# Patient Record
Sex: Female | Born: 1940 | Race: White | Hispanic: No | Marital: Married | Smoking: Never smoker
Health system: Southern US, Community
[De-identification: ages and names within clinical notes are randomized; demographics above are authoritative.]

## PROBLEM LIST (undated history)

## (undated) DIAGNOSIS — F419 Anxiety disorder, unspecified: Secondary | ICD-10-CM

## (undated) DIAGNOSIS — C4490 Unspecified malignant neoplasm of skin, unspecified: Secondary | ICD-10-CM

## (undated) DIAGNOSIS — N390 Urinary tract infection, site not specified: Secondary | ICD-10-CM

## (undated) DIAGNOSIS — R634 Abnormal weight loss: Secondary | ICD-10-CM

## (undated) DIAGNOSIS — K5900 Constipation, unspecified: Secondary | ICD-10-CM

## (undated) DIAGNOSIS — F0390 Unspecified dementia without behavioral disturbance: Secondary | ICD-10-CM

## (undated) HISTORY — PX: CATARACT EXTRACTION: SUR2

## (undated) HISTORY — PX: OTHER SURGICAL HISTORY: SHX169

## (undated) HISTORY — PX: APPENDECTOMY: SHX54

---

## 1998-12-18 ENCOUNTER — Encounter: Payer: Self-pay | Admitting: Emergency Medicine

## 1998-12-18 ENCOUNTER — Emergency Department (HOSPITAL_COMMUNITY): Admission: EM | Admit: 1998-12-18 | Discharge: 1998-12-19 | Payer: Self-pay | Admitting: Emergency Medicine

## 1999-02-07 ENCOUNTER — Other Ambulatory Visit: Admission: RE | Admit: 1999-02-07 | Discharge: 1999-02-07 | Payer: Self-pay | Admitting: Obstetrics & Gynecology

## 1999-08-17 ENCOUNTER — Other Ambulatory Visit: Admission: RE | Admit: 1999-08-17 | Discharge: 1999-08-17 | Payer: Self-pay | Admitting: Obstetrics & Gynecology

## 2000-07-06 ENCOUNTER — Emergency Department (HOSPITAL_COMMUNITY): Admission: EM | Admit: 2000-07-06 | Discharge: 2000-07-06 | Payer: Self-pay | Admitting: Emergency Medicine

## 2000-09-09 ENCOUNTER — Other Ambulatory Visit: Admission: RE | Admit: 2000-09-09 | Discharge: 2000-09-09 | Payer: Self-pay | Admitting: Obstetrics and Gynecology

## 2001-10-21 ENCOUNTER — Other Ambulatory Visit: Admission: RE | Admit: 2001-10-21 | Discharge: 2001-10-21 | Payer: Self-pay | Admitting: Obstetrics & Gynecology

## 2002-03-06 ENCOUNTER — Emergency Department (HOSPITAL_COMMUNITY): Admission: EM | Admit: 2002-03-06 | Discharge: 2002-03-07 | Payer: Self-pay | Admitting: Emergency Medicine

## 2002-03-08 ENCOUNTER — Emergency Department (HOSPITAL_COMMUNITY): Admission: EM | Admit: 2002-03-08 | Discharge: 2002-03-08 | Payer: Self-pay | Admitting: Emergency Medicine

## 2002-10-20 ENCOUNTER — Other Ambulatory Visit: Admission: RE | Admit: 2002-10-20 | Discharge: 2002-10-20 | Payer: Self-pay | Admitting: Obstetrics & Gynecology

## 2003-10-18 ENCOUNTER — Other Ambulatory Visit: Admission: RE | Admit: 2003-10-18 | Discharge: 2003-10-18 | Payer: Self-pay | Admitting: Obstetrics & Gynecology

## 2008-05-30 ENCOUNTER — Emergency Department (HOSPITAL_COMMUNITY): Admission: EM | Admit: 2008-05-30 | Discharge: 2008-05-30 | Payer: Self-pay | Admitting: Emergency Medicine

## 2008-07-20 ENCOUNTER — Inpatient Hospital Stay (HOSPITAL_COMMUNITY): Admission: RE | Admit: 2008-07-20 | Discharge: 2008-07-22 | Payer: Self-pay | Admitting: Obstetrics and Gynecology

## 2008-07-20 ENCOUNTER — Encounter (INDEPENDENT_AMBULATORY_CARE_PROVIDER_SITE_OTHER): Payer: Self-pay | Admitting: Obstetrics and Gynecology

## 2011-02-13 NOTE — H&P (Signed)
NAMEDESERAY, DAPONTE                 ACCOUNT NO.:  [id number]   MEDICAL RECORD NO.:  [id number]          PATIENT TYPE:  AMB   LOCATION:  SDC                           FACILITY:  [facility]   PHYSICIAN:  Randye Lobo, M.D.   DATE OF BIRTH:  05/17/1941   DATE OF ADMISSION:  DATE OF DISCHARGE:                              HISTORY & PHYSICAL   CHIEF COMPLAINT:  Uterine prolapse and urinary incontinence.   HISTORY OF PRESENT ILLNESS:  The patient is a 70 year old gravida 4,  para 3-0-1-3 Caucasian female, status post bilateral tubal ligation, who  presents with worsening uterine prolapse and the presence of urinary  incontinence.  The patient has been followed for the above symptoms and  has chosen to avoid surgery until this time due to the progressive  nature of the problem.  The patient has failed the use of a pessary.  She is experiencing urinary incontinence with sneezing.  She has  developed recurrent urinary tract infections this summer, and she was  seen at [facility] on May 30, 2008, due to severe  constipation.   In preparation for the patient's upcoming surgery, she did undergo  multichannel urodynamic studies.  She had her pessary in place during  this procedure.  The uroflowmetry study documented a void of 168 mL with  a postvoid residual of 150 mL.  The patient had the multichannel  cystometrics performed and she developed a detrusor contraction at 236  mL at which time she had uncontrolled urinary incontinence and was not  able to fill further.   The patient now wishes for surgical treatment of the prolapse and the  incontinence.   The patient's past obstetric and gynecologic histories are remarkable  for:  1. Status post vaginal delivery x3.  2. Status post bilateral tubal ligation.  3. The patient's last Pap smear was performed on November 26, 2007,      and was within normal limits.  Her last mammogram was performed on      June 03, 2007, and was within  normal limits.  4. The patient has been on Prempro hormone therapy, and she has      stopped this in preparation for her procedure.   PAST MEDICAL HISTORY:  1. Hyperlipidemia.  2. Recurrent urinary tract infections.   SURGICAL HISTORY:  Status post bilateral tubal ligation.   MEDICATIONS:  Zyrtec, Prempro, a hyperlipidemic of unknown name.   ALLERGIES:  CODEINE.   SOCIAL HISTORY:  The patient is married.  She works for [employer]  in [place].  She denies the use of tobacco.   PHYSICAL EXAMINATION:  VITAL SIGNS:  The patient's height is 5 feet 2-  1/2 inches, weight 112 pounds, blood pressure 100/66.  GENERAL:  The  patient is an elderly Caucasian female in no acute distress.  HEENT:  Normocephalic, atraumatic.  LUNGS:  Clear to auscultation bilaterally.  HEART:  S1-S2 with a regular rate and rhythm.  ABDOMEN:  Soft and nontender with no evidence of hepatosplenomegaly or  organomegaly.  PELVIC:  There is evidence of complete uterine procidentia.  The vaginal  mucosa demonstrates no ulcerations.  The cervix appears to be  unremarkable.  With reduction of the prolapse and bimanual exam, the  uterus is small and nontender and there is no evidence adnexal masses or  tenderness.   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   PLAN:  The patient will undergo a total vaginal hysterectomy with  possible bilateral salpingo-oophorectomy, an anterior and posterior  colporrhaphy, a sacrospinous vaginal vault fixation, tension-free  vaginal tape suburethral sling and cystoscopy at the [facility]  [place] on July 20, 2008.  The risks, benefits, and alternatives  have been discussed with the patient, who wishes to proceed.      Randye Lobo, M.D.  Electronically Signed     BES/MEDQ  D:  07/19/2008  T:  07/19/2008  Job:   [id number]

## 2011-02-13 NOTE — Op Note (Signed)
Gwendolyn Bright, Gwendolyn Bright                 ACCOUNT NO.:  [id number]   MEDICAL RECORD NO.:  [id number]          PATIENT TYPE:  INP   LOCATION:  9316                          [facility]ACILITY:  [facility]   PHYSICIAN:  Randye Lobo, M.D.   DATE OF BIRTH:  16-Nov-1940   DATE OF PROCEDURE:  07/20/2008  DATE OF DISCHARGE:                               OPERATIVE REPORT   PREOPERATIVE DIAGNOSES:  1. Complete uterovaginal prolapse.  2. Genuine stress incontinence.   POSTOPERATIVE DIAGNOSES:  1. Complete uterovaginal prolapse.  2. Genuine stress incontinence.   PROCEDURE:  Total vaginal hysterectomy, anterior and posterior  colporrhaphy, sacrospinous vaginal vault suspension, tension-free  vaginal tape mid urethral sling, and cystoscopy.   SURGEON:  Randye Lobo, MD   ASSISTANT:  Gerrit Friends. Aldona Bar, MD   ANESTHESIA:  General endotracheal, local with 0.5% lidocaine with  epinephrine 1:200,000.   IV FLUIDS:  2000 mL Ringer's lactate.   ESTIMATED BLOOD LOSS:  200 mL   URINE OUTPUT:  700 mL   COMPLICATIONS:  None.   INDICATIONS FOR THE PROCEDURE:  The patient is a 70 year old gravida 4,  para 3-0-1-3 Caucasian female, status post bilateral tubal ligation who  presented with worsening uterine prolapse and urinary incontinence with  sneezing.  The patient has failed the use of a pessary and has developed  recurrent urinary tract infections and severe constipation, and she now  chooses to proceed with the surgical treatment of the prolapse and  incontinence.  The patient did have multichannel urodynamic testing in  the office and developed a detrusor contraction during her cystometric  portion of the study, at which time, she lost a complete control of her  bladder and was unable to fill further.  The patient had reduction of  her prolapse during the urodynamic procedure using her pessary.  The  patient was experiencing stress incontinence with a pessary in place.  She is noted to have complete procidentia  on exam.  A plan is now made  to proceed with a total vaginal hysterectomy with possible bilateral  salpingo-oophorectomy, an anterior and posterior colporrhaphy, a  sacrospinous vaginal vault suspension, a TVT mid urethral sling, and a  cystoscopy after risks, benefits, and alternatives were reviewed.   FINDINGS:  Exam under anesthesia revealed complete procidentia.  There  was a very significant cystocele component to the procidentia and there  was less rectocele appreciated.  The uterus, tubes, and ovaries were  unremarkable.  The bilateral ovaries appeared to be very atrophic and  streak-like along the pelvic sidewalls.   Cystoscopy during placement of the sling demonstrated a absence of a  foreign body in either the bladder or the urethra.  The bladder was  noted to be normal throughout 360 degrees including the bladder dome and  trigone.  The ureters were noted to be patent bilaterally after the  injection of indigo carmine dye IV.   SPECIMENS:  The uterus was sent to Pathology.   PROCEDURE:  The patient was reidentified in the preoperative hold area.  She received cefotetan 2 g IV for antibiotic prophylaxis.  She  received  both TED hose and PAS stockings for DVT prophylaxis.   In the operating room, general endotracheal anesthesia was induced and  the patient was then placed in the dorsal lithotomy position.  The lower  abdomen, vagina, and perineum were sterilely prepped and draped.  A  Foley catheter was placed inside the bladder.  A tenaculum was placed on  the anterior and posterior cervical lips, and the cervix was injected  locally with 0.5% lidocaine with 1:200,000 of epinephrine.  The cervix  was then circumscribed with a scalpel.  Entry into the posterior cul-de-  sac was performed sharply with a Mayo scissors.  Digital exam confirmed  proper entry into this location.  Each of the uterosacral ligaments were  then clamped, sharply divided, and suture ligated with  transfixing  sutures of 0 Vicryl.  The bladder was then dissected off of the lower  uterine segment.  Each of the bladder pillars were clamped, sharply  divided, and suture ligated with 0 Vicryl bilaterally.  Dissection  continued anteriorly to dissect the bladder off of the lower uterine  segment.  The upper portion of the uterosacral ligaments were then  clamped, sharply divided, and suture ligated with 0 Vicryl bilaterally.   With continued dissection in the uterovesical fold, it became clear that  the dissection was going into the lower uterine segment and the plane  was reidentified and then the anterior cul-de-sac was entered sharply.  Again, digital exam confirmed proper entry into this location.   The cardinal ligaments were then clamped, sharply divided, and suture  ligated with 0 Vicryl bilaterally.  The procedure continued through the  broad ligaments, which were similarly clamped with a Heaney clamp,  sharply divided, and suture ligated with 0 Vicryl on each side.  The  Heaney clamps were then used to clamp across the adnexal structures  including the remainder of the proximal fallopian tube, utero-ovarian  ligament, and the round ligament bilaterally.  These pedicles were then  sharply divided.  Each of the pedicles was free tied with a free tie of  0 Vicryl followed by a suture ligature of the same.  These pedicles were  held at this time.  All of the pedicles were examined and hemostasis was  noted to be good.   The anterior colporrhaphy was performed next.  Allis clamps were used to  mark the midline of the anterior vaginal mucosa.  The mucosa was  injected locally with 0.5% lidocaine with 1:200,000 of epinephrine.  The  vaginal mucosa was then incised vertically with the Metzenbaum scissors.  Sharp dissection was used to dissect the subvaginal tissue and bladder  off the overlying vaginal mucosa.  The dissection was carried anteriorly  to the level of the pubic rami  and posteriorly to the level of the  uterosacral ligament pedicles.  Hemostasis during the dissection was  created with monopolar cautery.   The mid urethral sling was performed next.  A 1-cm suprapubic incisions  were created and 2 cm to the right and left in the midline.  The sling  was performed in a top-down fashion.  The abdominal needle passer was  placed through the right suprapubic incision and out through the  endopelvic fascia along the ipsilateral side and lateral to the mid  urethra.  The same procedure that was performed on the right-hand side  was then repeated on the left-hand side.  The Foley catheter was removed  at this time and cystoscopy was performed, and the  findings are as noted  above.  The cystoscopic fluid was then completely drained from the  bladder and the Foley catheter was replaced.  The sling was attached to  the abdominal needle passers and the sling was drawn up through the  suprapubic incisions bilaterally.  The plastic sheaths were separated  from the surrounding sling and the plastic sheaths were removed while a  Kelly clamp was placed between the sling and the urethra.  Excess sling  was trimmed suprapubically and the sling was noted to be in excellent  position.   The anterior colporrhaphy was next performed by placing vertical  mattress sutures of 2-0 Vicryl for reduction of the cystocele.  Excess  vaginal mucosa was then trimmed, and the anterior vaginal wall was  closed with a running locked suture of 2-0 Vicryl.  The vaginal cuff was  then closed with a running locked suture of 0 Vicryl.   The posterior colporrhaphy and the sacrospinous fixation were performed  next.  Allis clamps were used to mark the perineal body and the  posterior vaginal mucosa to a level of 2 cm below the vaginal cuff.  The  mucosa was injected locally with 0.5% percent lidocaine with 1:200,000  of epinephrine.  A triangular wedge of epithelium was excised from the   perineal body and the posterior vaginal mucosa was incised vertically  with a Metzenbaum scissors.  The rectovaginal fascia was dissected off  of the posterior vaginal mucosa bilaterally using a combination of sharp  and blunt dissection.  Hemostasis was again created using monopolar  cautery.   Entry into the right perirectal space was performed next bluntly.  A  ischial spine was identified and the sacrospinous ligament as well.  The  sacrospinous sutures placed were 0 Ethibond, and a Capio instrument was  used.  Two sutures were placed side by side.  Each of the sutures were  placed 2 cm medial to the ischial spine along the uterosacral ligament.  Direct visualization and palpation was used for localization of the  sacrospinous ligament.  Each of the sutures were then attached to the  underside of the vaginal mucosa at the vaginal apex and these sutures  were held.   The posterior colporrhaphy was next performed by placing vertical  mattress sutures of a combination of 2-0 Vicryl, which transitioned to 0  Vicryl along the perineal body.  Excess vaginal mucosa was trimmed at  this time, and the posterior vaginal mucosa was closed with a running  locked suture of 2-0 Vicryl along half of its distance.   The sacrospinous sutures were each tied down onto the sacrospinous  ligaments.  A rectal exam was performed which documented the absence of  sutures in the rectum.  The remainder of the posterior vaginal wall was  then closed with a running locked suture of 2-0 Vicryl down to the level  of the hymen.  There was a small tear in the vaginal mucosa which  extended onto the right labia majora, which was repaired with a running  locked suture of 2-0 Vicryl.  The perineal body was supported by placing  2 crown sutures of 0 Vicryl.  The remainder of the vaginal mucosa along  the perineum was closed in a subcuticular fashion with 2-0 Vicryl.   As the anterior vaginal wall was reassessed at  this time, it became  clear that more vaginal mucosa needed to be trimmed.  The anterior  vaginal wall mucosa was therefore opened from the  vaginal cuff to the  level of 2 cm from the top of the incision.  Excess vaginal mucosa was  then further trimmed and the anterior vaginal wall was reclosed with a  running locked suture of 2-0 Vicryl.  This provided a satisfactory  result along the anterior vaginal wall.   The suprapubic incisions were closed with Dermabond.  A packing with  Estrace cream was placed in the vagina.   The patient was cleansed of any remaining Betadine.  She was awakened  and escorted to the recovery room in stable and awake condition.  There  were no complications to the procedure.  All needle, instrument, and  sponge counts, were correct.      Randye Lobo, M.D.  Electronically Signed     BES/MEDQ  D:  07/20/2008  T:  07/20/2008  Job:  [id number]

## 2011-02-16 NOTE — Discharge Summary (Signed)
[name], Bright                 ACCOUNT NO.:  [id number]   MEDICAL RECORD NO.:  [id number]          PATIENT TYPE:  INP   LOCATION:  9316                          [facility]   PHYSICIAN:  Randye Lobo, M.D.   DATE OF BIRTH:  [birth year]   DATE OF ADMISSION:  07/20/2008  DATE OF DISCHARGE:  07/22/2008                               DISCHARGE SUMMARY   ADMISSION DIAGNOSES:  1. Complete uterovaginal prolapse.  2. Genuine stress incontinence.   DISCHARGE DIAGNOSES:  1. Complete uterovaginal prolapse.  2. Genuine stress incontinence.  3. Status post total vaginal hysterectomy, anterior and posterior      colporrhaphy, sacrospinous vaginal vault suspension, TVT  mid      urethral sling, cystoscopy.   SIGNIFICANT OPERATIONS AND PROCEDURES:  The patient underwent a total  vaginal hysterectomy with anterior and posterior colporrhaphy,  sacrospinous vaginal vault suspension procedure, tension-free vaginal  tape, mid urethral sling and cystoscopy on July 20, 2008, at the  [facility] at [place], under the direction of Dr. Conley Simmonds  with the assistance of Dr. Annamaria Helling.   ADMISSION HISTORY AND PHYSICAL EXAMINATION:  The patient is a 70-year-  old, para 39, Caucasian female, status post bilateral tubal ligation who  presented to her primary gynecologist, Dr. Annamaria Helling reporting  worsening uterine prolapse and the development of urinary incontinence.  The patient had a trial of a pessary, which she failed, and she was  experiencing urinary incontinence with forceful maneuvers such as  sneezing.  The patient has developed problems with recurrent urinary  tract infections and severe constipation, and now chooses to proceed  with her surgical treatment of her prolapse and incontinence.   PHYSICAL EXAMINATION:  Physical exam was significant for complete  uterine procidentia.  The vagina demonstrated no mucosal ulceration and  the cervix was normal.  The uterus was noted  to be small and nontender  and no adnexal masses were appreciated.   HOSPITAL COURSE:  The patient was admitted on July 20, 2008, at which  time she underwent a total vaginal hysterectomy with anterior and  posterior colporrhaphy, right sacrospinous vaginal vault suspension,  tension-free vaginal tape, mid urethral sling and cystoscopy, which was  performed under general anesthesia and without complication.  Estimated  blood loss from surgery was 200 mL.   Postoperatively, the patient had good control of her pain with a  morphine PCA.  She did have a vaginal packing on her first night postop.  This was removed on postoperative day #1 and her hemoglobin at that time  was noted to be 10.9.   The patient began walking on postoperative day #1 and she was able to  ambulate independently.  The patient's diet was slowly advanced to  normal and she was converted over to oral pain medication including  Percocet and ibuprofen.   The patient did receive TED hose and PAS stockings for DVT prophylaxis  during her hospitalization.   On postoperative day #2, the patient had her Foley removed and she began  undergoing her voiding trials.  The patient was experiencing some  diarrhea on postop day #2.   The patient's suprapubic incisions remained intact during the  hospitalization and she had no signs of any excessive vaginal bleeding.   The final pathology report was pending at the time of the patient's  discharge.   The patient was found to be in good condition and ready for discharge on  postoperative day #2.   INSTRUCTIONS AT DISCHARGE:  1. The patient will take Percocet 5 mg/325 mg 1-2 p.o. q.4-6 h. p.r.n.      pain, ibuprofen 600 mg p.o. q.6 h. p.r.n. pain.  The patient will      resume her usual medications and the usual dosages.  The patient      will take Imodium if she experiences loose stools.  2. The patient will follow a regular diet.  3. The patient will be discharged to home  with a Foley catheter if her      postvoid residuals are over 100 mL.  4. The patient will continue to ambulate at home.  5. The patient will not drive for the following 2 weeks and she will      lift nothing over 10 pounds for the next 3 months.  6. The patient will follow up in the office in approximately 1 week.  7. The patient will call if she experiences any problems with fever,      nausea, and vomiting, pain uncontrolled by her medication, heavy      vaginal bleeding, difficulty with voiding, or her Foley catheter,      or any other concern.      Randye Lobo, M.D.  Electronically Signed     BES/MEDQ  D:  08/23/2008  T:  08/24/2008  Job:  [id number]

## 2011-07-03 LAB — COMPREHENSIVE METABOLIC PANEL
ALT: 15
AST: 24
Albumin: 3.4 — ABNORMAL LOW
Alkaline Phosphatase: 79
BUN: 12
CO2: 30
Calcium: 8.5
Chloride: 101
Creatinine, Ser: 0.77
GFR calc Af Amer: >60
GFR calc non Af Amer: >60
Glucose, Bld: 95
Potassium: 3.4 — ABNORMAL LOW
Sodium: 138
Total Bilirubin: 0.5
Total Protein: 6.5

## 2011-07-03 LAB — CBC
HCT: 32.9 — ABNORMAL LOW
HCT: 41.9
Hemoglobin: 13.8
MCHC: 32.9
MCV: 94.1
MCV: 94.2
Platelets: 185
Platelets: 235
RBC: 3.49 — ABNORMAL LOW
RBC: 4.45
RDW: 14.7
WBC: 14.9 — ABNORMAL HIGH
WBC: 9.8

## 2011-07-03 LAB — URINALYSIS, ROUTINE W REFLEX MICROSCOPIC
Bilirubin Urine: NEGATIVE
Glucose, UA: NEGATIVE
Ketones, ur: NEGATIVE
Leukocytes, UA: NEGATIVE
Nitrite: NEGATIVE
Protein, ur: NEGATIVE
Specific Gravity, Urine: 1.015
Urobilinogen, UA: 0.2
pH: 6.5

## 2011-07-03 LAB — BASIC METABOLIC PANEL
BUN: 5 — ABNORMAL LOW
Chloride: 108
Potassium: 4.2

## 2011-07-03 LAB — URINE MICROSCOPIC-ADD ON

## 2012-06-13 ENCOUNTER — Other Ambulatory Visit: Payer: Self-pay | Admitting: Surgery

## 2014-03-10 ENCOUNTER — Other Ambulatory Visit: Payer: Self-pay | Admitting: Obstetrics and Gynecology

## 2014-03-11 LAB — CYTOLOGY - PAP

## 2014-10-20 DIAGNOSIS — R05 Cough: Secondary | ICD-10-CM | POA: Diagnosis not present

## 2014-11-28 DIAGNOSIS — R1013 Epigastric pain: Secondary | ICD-10-CM | POA: Diagnosis not present

## 2015-01-09 ENCOUNTER — Encounter (HOSPITAL_BASED_OUTPATIENT_CLINIC_OR_DEPARTMENT_OTHER): Payer: Self-pay

## 2015-01-09 ENCOUNTER — Emergency Department (HOSPITAL_BASED_OUTPATIENT_CLINIC_OR_DEPARTMENT_OTHER)
Admission: EM | Admit: 2015-01-09 | Discharge: 2015-01-09 | Disposition: A | Discharge status: Home / Self Care | Payer: BLUE CROSS/BLUE SHIELD | Attending: Emergency Medicine | Admitting: Emergency Medicine

## 2015-01-09 DIAGNOSIS — Z85828 Personal history of other malignant neoplasm of skin: Secondary | ICD-10-CM | POA: Diagnosis not present

## 2015-01-09 DIAGNOSIS — Z79899 Other long term (current) drug therapy: Secondary | ICD-10-CM | POA: Insufficient documentation

## 2015-01-09 DIAGNOSIS — A499 Bacterial infection, unspecified: Secondary | ICD-10-CM

## 2015-01-09 DIAGNOSIS — N390 Urinary tract infection, site not specified: Secondary | ICD-10-CM | POA: Diagnosis not present

## 2015-01-09 DIAGNOSIS — R300 Dysuria: Secondary | ICD-10-CM | POA: Diagnosis present

## 2015-01-09 HISTORY — DX: Unspecified malignant neoplasm of skin, unspecified: C44.90

## 2015-01-09 LAB — URINALYSIS, ROUTINE W REFLEX MICROSCOPIC
BILIRUBIN URINE: NEGATIVE
Glucose, UA: NEGATIVE mg/dL
Ketones, ur: NEGATIVE mg/dL
Nitrite: NEGATIVE
PROTEIN: 30 mg/dL — AB
Specific Gravity, Urine: 1.015 (ref 1.005–1.030)
UROBILINOGEN UA: 0.2 mg/dL (ref 0.0–1.0)
pH: 6.5 (ref 5.0–8.0)

## 2015-01-09 LAB — URINE MICROSCOPIC-ADD ON

## 2015-01-09 MED ORDER — PHENAZOPYRIDINE HCL 200 MG PO TABS: 200.0000 mg | ORAL_TABLET | Freq: Three times a day (TID) | ORAL | Status: DC

## 2015-01-09 MED ORDER — CIPROFLOXACIN HCL 500 MG PO TABS: 500.0000 mg | ORAL_TABLET | Freq: Two times a day (BID) | ORAL | Status: DC

## 2015-01-09 NOTE — Discharge Instructions (Signed)
Cipro as prescribed.  Pyridium as prescribed as needed for dysuria.  Return to the ER if you develop high fever, vomiting, severe abdominal pain, or other new and concerning symptoms.   Urinary Tract Infection Urinary tract infections (UTIs) can develop anywhere along your urinary tract. Your urinary tract is your body's drainage system for removing wastes and extra water. Your urinary tract includes two kidneys, two ureters, a bladder, and a urethra. Your kidneys are a pair of bean-shaped organs. Each kidney is about the size of your fist. They are located below your ribs, one on each side of your spine. CAUSES Infections are caused by microbes, which are microscopic organisms, including fungi, viruses, and bacteria. These organisms are so small that they can only be seen through a microscope. Bacteria are the microbes that most commonly cause UTIs. SYMPTOMS  Symptoms of UTIs may vary by age and gender of the patient and by the location of the infection. Symptoms in [place] women typically include a frequent and intense urge to urinate and a painful, burning feeling in the bladder or urethra during urination. Older women and men are more likely to be tired, shaky, and weak and have muscle aches and abdominal pain. A fever may mean the infection is in your kidneys. Other symptoms of a kidney infection include pain in your back or sides below the ribs, nausea, and vomiting. DIAGNOSIS To diagnose a UTI, your caregiver will ask you about your symptoms. Your caregiver also will ask to provide a urine sample. The urine sample will be tested for bacteria and white blood cells. White blood cells are made by your body to help fight infection. TREATMENT  Typically, UTIs can be treated with medication. Because most UTIs are caused by a bacterial infection, they usually can be treated with the use of antibiotics. The choice of antibiotic and length of treatment depend on your symptoms and the type of bacteria  causing your infection. HOME CARE INSTRUCTIONS  If you were prescribed antibiotics, take them exactly as your caregiver instructs you. Finish the medication even if you feel better after you have only taken some of the medication.  Drink enough water and fluids to keep your urine clear or pale yellow.  Avoid caffeine, tea, and carbonated beverages. They tend to irritate your bladder.  Empty your bladder often. Avoid holding urine for long periods of time.  Empty your bladder before and after sexual intercourse.  After a bowel movement, women should cleanse from front to back. Use each tissue only once. SEEK MEDICAL CARE IF:   You have back pain.  You develop a fever.  Your symptoms do not begin to resolve within 3 days. SEEK IMMEDIATE MEDICAL CARE IF:   You have severe back pain or lower abdominal pain.  You develop chills.  You have nausea or vomiting.  You have continued burning or discomfort with urination. MAKE SURE YOU:   Understand these instructions.  Will watch your condition.  Will get help right away if you are not doing well or get worse. Document Released: 06/27/2005 Document Revised: 03/18/2012 Document Reviewed: 10/26/2011 [facility] Patient Information 2015 [place], [place]. This information is not intended to replace advice given to you by your health care provider. Make sure you discuss any questions you have with your health care provider.

## 2015-01-09 NOTE — ED Provider Notes (Signed)
CSN: [id number]     Arrival date & time 01/09/15  1250 History   First MD Initiated Contact with Patient 01/09/15 1315     Chief Complaint  Patient presents with  . Dysuria     (Consider location/radiation/quality/duration/timing/severity/associated sxs/prior Treatment) HPI Comments: Patient is an otherwise healthy 74 year old female who presents for evaluation of urinary frequency and burning with urination that started yesterday. She denies any fevers, chills, vomiting, flank pain.  Patient is a 74 y.o. female presenting with dysuria. The history is provided by the patient.  Dysuria Pain quality:  Burning Pain severity:  Moderate Onset quality:  Sudden Duration:  1 day Timing:  Constant Progression:  Worsening Chronicity:  New Relieved by:  Nothing Ineffective treatments:  None tried Associated symptoms: no fever, no flank pain, no nausea and no vomiting     Past Medical History  Diagnosis Date  . Skin cancer    Past Surgical History  Procedure Laterality Date  . Skin cancer remo    . Cataract extraction     No family history on file. History  Substance Use Topics  . Smoking status: Never Smoker   . Smokeless tobacco: Not on file  . Alcohol Use: No   OB History    No data available     Review of Systems  Constitutional: Negative for fever.  Gastrointestinal: Negative for nausea and vomiting.  Genitourinary: Positive for dysuria. Negative for flank pain.  All other systems reviewed and are negative.     Allergies  Codeine  Home Medications   Prior to Admission medications   Medication Sig Start Date End Date Taking? Authorizing Provider  omeprazole (PRILOSEC) 10 MG capsule Take 10 mg by mouth daily.   Yes Historical Provider, MD  Vitamin D, Ergocalciferol, (DRISDOL) 50000 UNITS CAPS capsule Take 50,000 Units by mouth every 7 (seven) days.   Yes Historical Provider, MD   BP 118/59 mmHg  Pulse 80  Temp(Src) 97.9 F (36.6 C) (Oral)  Resp 16  Ht 5'  2" (1.575 m)  Wt 107 lb (48.535 kg)  BMI 19.57 kg/m2  SpO2 100% Physical Exam  Constitutional: She is oriented to person, place, and time. She appears well-developed and well-nourished. No distress.  HENT:  Head: Normocephalic and atraumatic.  Neck: Normal range of motion. Neck supple.  Cardiovascular: Normal rate and regular rhythm.  Exam reveals no gallop and no friction rub.   No murmur heard. Pulmonary/Chest: Effort normal and breath sounds normal. No respiratory distress. She has no wheezes.  Abdominal: Soft. Bowel sounds are normal. She exhibits no distension. There is no tenderness.  Musculoskeletal: Normal range of motion.  Neurological: She is alert and oriented to person, place, and time.  Skin: Skin is warm and dry. She is not diaphoretic.  Nursing note and vitals reviewed.   ED Course  Procedures (including critical care time) Labs Review Labs Reviewed  URINALYSIS, ROUTINE W REFLEX MICROSCOPIC    Imaging Review No results found.   EKG Interpretation None      MDM   Final diagnoses:  None    Urinalysis reflective of a urinary tract infection. We'll treat with Cipro and Pyron ileum. The return as needed for any problems.    Veryl Speak, MD 01/09/15 [phone]

## 2015-01-09 NOTE — ED Notes (Signed)
Pt reports dysuria, urinary frequency/urgency that began yesterday - pt denies fever or hematuria, concerned she may have a UTI.

## 2015-01-09 NOTE — ED Notes (Signed)
Pt reports dysuria and urinary frequency since yesterday.  Denies fever or n/v/d.

## 2015-04-09 DIAGNOSIS — L740 Miliaria rubra: Secondary | ICD-10-CM | POA: Diagnosis not present

## 2015-04-09 DIAGNOSIS — Z681 Body mass index (BMI) 19 or less, adult: Secondary | ICD-10-CM | POA: Diagnosis not present

## 2015-06-22 DIAGNOSIS — G4700 Insomnia, unspecified: Secondary | ICD-10-CM | POA: Diagnosis not present

## 2015-06-22 DIAGNOSIS — L740 Miliaria rubra: Secondary | ICD-10-CM | POA: Diagnosis not present

## 2015-06-22 DIAGNOSIS — R233 Spontaneous ecchymoses: Secondary | ICD-10-CM | POA: Diagnosis not present

## 2015-06-22 DIAGNOSIS — Z681 Body mass index (BMI) 19 or less, adult: Secondary | ICD-10-CM | POA: Diagnosis not present

## 2015-07-01 DIAGNOSIS — Z1231 Encounter for screening mammogram for malignant neoplasm of breast: Secondary | ICD-10-CM | POA: Diagnosis not present

## 2015-07-03 DIAGNOSIS — Z23 Encounter for immunization: Secondary | ICD-10-CM | POA: Diagnosis not present

## 2015-08-15 DIAGNOSIS — Z01419 Encounter for gynecological examination (general) (routine) without abnormal findings: Secondary | ICD-10-CM | POA: Diagnosis not present

## 2015-08-15 DIAGNOSIS — Z681 Body mass index (BMI) 19 or less, adult: Secondary | ICD-10-CM | POA: Diagnosis not present

## 2015-08-29 ENCOUNTER — Emergency Department (HOSPITAL_BASED_OUTPATIENT_CLINIC_OR_DEPARTMENT_OTHER)
Admission: EM | Admit: 2015-08-29 | Discharge: 2015-08-29 | Discharge status: Against Medical Advice | Payer: BLUE CROSS/BLUE SHIELD | Attending: Emergency Medicine | Admitting: Emergency Medicine

## 2015-08-29 ENCOUNTER — Encounter (HOSPITAL_BASED_OUTPATIENT_CLINIC_OR_DEPARTMENT_OTHER): Payer: Self-pay

## 2015-08-29 ENCOUNTER — Emergency Department (HOSPITAL_BASED_OUTPATIENT_CLINIC_OR_DEPARTMENT_OTHER): Payer: BLUE CROSS/BLUE SHIELD

## 2015-08-29 DIAGNOSIS — Z85828 Personal history of other malignant neoplasm of skin: Secondary | ICD-10-CM | POA: Insufficient documentation

## 2015-08-29 DIAGNOSIS — M6281 Muscle weakness (generalized): Secondary | ICD-10-CM | POA: Diagnosis not present

## 2015-08-29 DIAGNOSIS — R404 Transient alteration of awareness: Secondary | ICD-10-CM | POA: Diagnosis not present

## 2015-08-29 DIAGNOSIS — R4182 Altered mental status, unspecified: Secondary | ICD-10-CM | POA: Diagnosis present

## 2015-08-29 DIAGNOSIS — Z79899 Other long term (current) drug therapy: Secondary | ICD-10-CM | POA: Diagnosis not present

## 2015-08-29 DIAGNOSIS — R42 Dizziness and giddiness: Secondary | ICD-10-CM | POA: Diagnosis not present

## 2015-08-29 DIAGNOSIS — R2990 Unspecified symptoms and signs involving the nervous system: Secondary | ICD-10-CM

## 2015-08-29 LAB — COMPREHENSIVE METABOLIC PANEL
ALBUMIN: 3.7 g/dL (ref 3.5–5.0)
ALK PHOS: 80 U/L (ref 38–126)
ALT: 17 U/L (ref 14–54)
AST: 26 U/L (ref 15–41)
Anion gap: 7 (ref 5–15)
BILIRUBIN TOTAL: 0.5 mg/dL (ref 0.3–1.2)
BUN: 13 mg/dL (ref 6–20)
CALCIUM: 8.9 mg/dL (ref 8.9–10.3)
CO2: 28 mmol/L (ref 22–32)
CREATININE: 0.72 mg/dL (ref 0.44–1.00)
Chloride: 108 mmol/L (ref 101–111)
GFR calc Af Amer: >60 mL/min (ref >60)
GFR calc non Af Amer: >60 mL/min (ref >60)
GLUCOSE: 96 mg/dL (ref 65–99)
Potassium: 4 mmol/L (ref 3.5–5.1)
SODIUM: 143 mmol/L (ref 135–145)
Total Protein: 6.8 g/dL (ref 6.5–8.1)

## 2015-08-29 LAB — CBC
HCT: 41.9 % (ref 36.0–46.0)
HEMOGLOBIN: 13.7 g/dL (ref 12.0–15.0)
MCH: 30 pg (ref 26.0–34.0)
MCHC: 32.7 g/dL (ref 30.0–36.0)
MCV: 91.9 fL (ref 78.0–100.0)
PLATELETS: 193 10*3/uL (ref 150–400)
RBC: 4.56 MIL/uL (ref 3.87–5.11)
RDW: 13.9 % (ref 11.5–15.5)
WBC: 6.6 10*3/uL (ref 4.0–10.5)

## 2015-08-29 LAB — DIFFERENTIAL
Basophils Absolute: 0 10*3/uL (ref 0.0–0.1)
Basophils Relative: 1 %
Eosinophils Absolute: 0.2 10*3/uL (ref 0.0–0.7)
Eosinophils Relative: 3 %
LYMPHS ABS: 3 10*3/uL (ref 0.7–4.0)
LYMPHS PCT: 45 %
Monocytes Absolute: 0.7 10*3/uL (ref 0.1–1.0)
Monocytes Relative: 10 %
NEUTROS ABS: 2.7 10*3/uL (ref 1.7–7.7)
NEUTROS PCT: 41 %

## 2015-08-29 LAB — RAPID URINE DRUG SCREEN, HOSP PERFORMED
Amphetamines: NOT DETECTED
BARBITURATES: NOT DETECTED
Benzodiazepines: NOT DETECTED
Cocaine: NOT DETECTED
Opiates: NOT DETECTED
Tetrahydrocannabinol: NOT DETECTED

## 2015-08-29 LAB — URINALYSIS, ROUTINE W REFLEX MICROSCOPIC
Bilirubin Urine: NEGATIVE
Glucose, UA: NEGATIVE mg/dL
HGB URINE DIPSTICK: NEGATIVE
Ketones, ur: NEGATIVE mg/dL
NITRITE: NEGATIVE
PROTEIN: NEGATIVE mg/dL
Specific Gravity, Urine: 1.016 (ref 1.005–1.030)
pH: 6 (ref 5.0–8.0)

## 2015-08-29 LAB — PROTIME-INR
INR: 1.01 (ref 0.00–1.49)
PROTHROMBIN TIME: 13.5 s (ref 11.6–15.2)

## 2015-08-29 LAB — URINE MICROSCOPIC-ADD ON

## 2015-08-29 LAB — TROPONIN I

## 2015-08-29 LAB — ETHANOL: Alcohol, Ethyl (B): <5 mg/dL (ref <5)

## 2015-08-29 LAB — APTT: aPTT: 27 seconds (ref 24–37)

## 2015-08-29 NOTE — ED Notes (Signed)
I took Bp again with smaller cuff with result of 121/110.

## 2015-08-29 NOTE — ED Notes (Signed)
Pt reports had episode this am prior to leaving for work.  Became dizzy with blurred vision and having difficulty walking.  Reports no headache.  Reports symptoms have resolved now.  L pupil >R pupil.  No weakness noted on exam

## 2015-08-29 NOTE — ED Notes (Signed)
Pt reports unsteady gait, blurred vision-denies difficulty with speech, swallowing or weakness-pt A/O-presented to triage in w/c then stood and walked to chair

## 2015-08-29 NOTE — ED Provider Notes (Signed)
CSN: [id number]     Arrival date & time 08/29/15  1130 History   First MD Initiated Contact with Patient 08/29/15 1144     Chief Complaint  Patient presents with  . Gait Problem     (Consider location/radiation/quality/duration/timing/severity/associated sxs/prior Treatment) Patient is a 74 y.o. female presenting with general illness. The history is provided by the patient and the spouse.  Illness Severity:  Moderate Onset quality:  Sudden Duration:  2 hours Timing:  Constant Progression:  Resolved Chronicity:  New Associated symptoms: no chest pain, no congestion, no fever, no headaches, no myalgias, no nausea, no rhinorrhea, no shortness of breath, no vomiting and no wheezing     74 yo F with a chief complaints of altered mental status. Per family she was grabbing some keys in about the go out the door and dry to do some errands when she suddenly felt unsteady on her feet and started shaking. Her husband ran over and assisted her to the bed. She then laid down and tried to get up about 5 minutes later and was unable to walk. At that point the husband felt that she was somewhat better than the initial symptoms. This lasted for about 2 hours per the husband. After which patient was back to normal. She is unsure if the feeling was like she is to pass out or whether or not the room spinning around her. Patient is unable to quantify the symptoms any further. Sitting over area that she just not sure how she felt. She denies any prior head injuries. She denies any chest pain or shortness of breath during the event denies diaphoresis.  Past Medical History  Diagnosis Date  . Skin cancer    Past Surgical History  Procedure Laterality Date  . Skin cancer remo    . Cataract extraction     No family history on file. Social History  Substance Use Topics  . Smoking status: Never Smoker   . Smokeless tobacco: None  . Alcohol Use: No   OB History    No data available     Review of  Systems  Constitutional: Negative for fever and chills.  HENT: Negative for congestion and rhinorrhea.   Eyes: Negative for redness and visual disturbance.  Respiratory: Negative for shortness of breath and wheezing.   Cardiovascular: Negative for chest pain and palpitations.  Gastrointestinal: Negative for nausea and vomiting.  Genitourinary: Negative for dysuria and urgency.  Musculoskeletal: Negative for myalgias and arthralgias.  Skin: Negative for pallor and wound.  Neurological: Positive for dizziness, speech difficulty and weakness (bilateral lower extremities). Negative for headaches.      Allergies  Codeine  Home Medications   Prior to Admission medications   Medication Sig Start Date End Date Taking? Authorizing Provider  Zolpidem Tartrate (AMBIEN PO) Take by mouth.   Yes Historical Provider, MD  omeprazole (PRILOSEC) 10 MG capsule Take 10 mg by mouth daily.    Historical Provider, MD  Vitamin D, Ergocalciferol, (DRISDOL) 50000 UNITS CAPS capsule Take 50,000 Units by mouth every 7 (seven) days.    Historical Provider, MD   BP 132/76 mmHg  Pulse 61  Temp(Src) 97.5 F (36.4 C) (Oral)  Resp 16  Ht 5' 2.5" (1.588 m)  Wt 105 lb (47.628 kg)  BMI 18.89 kg/m2  SpO2 99% Physical Exam  Constitutional: She is oriented to person, place, and time. She appears well-developed and well-nourished. No distress.  HENT:  Head: Normocephalic and atraumatic.  Eyes: EOM are normal. Pupils  are equal, round, and reactive to light.  Neck: Normal range of motion. Neck supple.  Cardiovascular: Normal rate and regular rhythm.  Exam reveals no gallop and no friction rub.   No murmur heard. Pulmonary/Chest: Effort normal. She has no wheezes. She has no rales.  Abdominal: Soft. She exhibits no distension. There is no tenderness. There is no rebound and no guarding.  Musculoskeletal: She exhibits no edema or tenderness.  Neurological: She is alert and oriented to person, place, and time. She  has normal strength. No cranial nerve deficit or sensory deficit. She displays a negative Romberg sign. Coordination and gait normal. GCS eye subscore is 4. GCS verbal subscore is 5. GCS motor subscore is 6. She displays no Babinski's sign on the right side. She displays no Babinski's sign on the left side.  Reflex Scores:      Tricep reflexes are 2+ on the right side and 2+ on the left side.      Bicep reflexes are 2+ on the right side and 2+ on the left side.      Brachioradialis reflexes are 2+ on the right side and 2+ on the left side.      Patellar reflexes are 2+ on the right side and 2+ on the left side.      Achilles reflexes are 2+ on the right side and 2+ on the left side. Skin: Skin is warm and dry. She is not diaphoretic.  Psychiatric: She has a normal mood and affect. She is withdrawn.  Nursing note and vitals reviewed.   ED Course  Procedures (including critical care time) Labs Review Labs Reviewed  URINALYSIS, ROUTINE W REFLEX MICROSCOPIC (NOT AT [facility]) - Abnormal; Notable for the following:    APPearance CLOUDY (*)    Leukocytes, UA SMALL (*)    All other components within normal limits  URINE MICROSCOPIC-ADD ON - Abnormal; Notable for the following:    Squamous Epithelial / LPF 6-30 (*)    Bacteria, UA MANY (*)    All other components within normal limits  ETHANOL  PROTIME-INR  APTT  CBC  DIFFERENTIAL  COMPREHENSIVE METABOLIC PANEL  URINE RAPID DRUG SCREEN, HOSP PERFORMED  TROPONIN I    Imaging Review Ct Head Wo Contrast  08/29/2015  CLINICAL DATA:  Dizziness and blurred vision with unsteady gait since leaving work this morning, ataxia, weakness, symptoms now resolved EXAM: CT HEAD WITHOUT CONTRAST TECHNIQUE: Contiguous axial images were obtained from the base of the skull through the vertex without intravenous contrast. COMPARISON:  None FINDINGS: Generalized atrophy. Normal ventricular morphology. No midline shift or mass effect. Small vessel chronic ischemic  changes of deep cerebral white matter. No intracranial hemorrhage, mass lesion, or acute infarction. Small amount of fluid or mucus in sphenoid sinus. Visualized paranasal sinuses and mastoid air cells otherwise clear. Bones unremarkable. IMPRESSION: No acute intracranial abnormalities. Generalized atrophy with small vessel chronic ischemic changes of deep cerebral white matter. Electronically Signed   By: Lavonia Dana M.D.   On: 08/29/2015 13:13   I have personally reviewed and evaluated these images and lab results as part of my medical decision-making.   EKG Interpretation   Date/Time:  Monday August 29 2015 12:24:20 EST Ventricular Rate:  61 PR Interval:  148 QRS Duration: 72 QT Interval:  414 QTC Calculation: 416 R Axis:   84 Text Interpretation:  Normal sinus rhythm Normal ECG No significant change  since last tracing Confirmed by Jaedyn Lard MD, DANIEL [phone]) on 08/29/2015  12:37:50 PM  MDM   Final diagnoses:  Transient alteration of awareness  Stroke-like symptoms    74 yo F with a chief complaints of altered awareness. This happened this morning. Lasted for about 2 hours. Difficulty discerning whether or not this is pre-syncope versus possible strokelike symptoms. Family said that she had a little bit of tremor with it but was able to talk to them throughout the event. Feel that seizures probably unlikely. CT of the head was negative. UDS EtOH blood sugar all normal. EKG and troponin negative. No significant anemia. UA with trace leukocytes small amount of white blood cells and tomato count bacteria. Patient without any urinary tract symptoms. Will discuss the case with neurology.  Neuro agrees patient needs to be examined by him.  Discussed transfer to [facility] with patient.  Currently declining, feels better with no symptoms.  Discussed at length that we are not sure of what caused this event with the possibility that this was cardiogenic vs tia and that the patient has a  significant risk of possible stroke or fatal arrhythmia maybe even this afternoon that could leave her dead or permanently disabled.  Patient states understanding and is able to repeat this back to me.  Will sign out AMA.    I have discussed the diagnosis/risks/treatment options with the patient and family and believe the pt to be eligible for discharge home to follow-up with PCP for further stroke workup. We also discussed returning to the ED immediately if new or worsening sx occur. We discussed the sx which are most concerning (e.g., sudden recurrent symptoms) that necessitate immediate return. Medications administered to the patient during their visit and any new prescriptions provided to the patient are listed below.  Medications given during this visit Medications - No data to display  Discharge Medication List as of 08/29/2015  2:29 PM         Deno Etienne, DO 08/30/15 1358

## 2015-08-29 NOTE — Discharge Instructions (Signed)
This may have been with a call a mini stroke. The concern with that is that your next event may have been today or tomorrow and may leave you personally disabled or dead. Please follow-up with your family doctor tomorrow if you decide to not come back to the hospital. If he returned to the hospital please go to [facility] were he can see the neurologist and get an MRI.

## 2016-01-21 ENCOUNTER — Encounter (HOSPITAL_BASED_OUTPATIENT_CLINIC_OR_DEPARTMENT_OTHER): Payer: Self-pay | Admitting: Emergency Medicine

## 2016-01-21 ENCOUNTER — Emergency Department (HOSPITAL_BASED_OUTPATIENT_CLINIC_OR_DEPARTMENT_OTHER)
Admission: EM | Admit: 2016-01-21 | Discharge: 2016-01-22 | Disposition: A | Discharge status: Home / Self Care | Payer: BLUE CROSS/BLUE SHIELD | Attending: Emergency Medicine | Admitting: Emergency Medicine

## 2016-01-21 DIAGNOSIS — N390 Urinary tract infection, site not specified: Secondary | ICD-10-CM | POA: Insufficient documentation

## 2016-01-21 DIAGNOSIS — Z85828 Personal history of other malignant neoplasm of skin: Secondary | ICD-10-CM | POA: Insufficient documentation

## 2016-01-21 DIAGNOSIS — Z79899 Other long term (current) drug therapy: Secondary | ICD-10-CM | POA: Insufficient documentation

## 2016-01-21 DIAGNOSIS — R350 Frequency of micturition: Secondary | ICD-10-CM | POA: Diagnosis present

## 2016-01-21 LAB — URINE MICROSCOPIC-ADD ON

## 2016-01-21 LAB — URINALYSIS, ROUTINE W REFLEX MICROSCOPIC
BILIRUBIN URINE: NEGATIVE
Glucose, UA: NEGATIVE mg/dL
KETONES UR: NEGATIVE mg/dL
NITRITE: NEGATIVE
Protein, ur: 30 mg/dL — AB
Specific Gravity, Urine: 1.018 (ref 1.005–1.030)
pH: 6.5 (ref 5.0–8.0)

## 2016-01-21 MED ORDER — PHENAZOPYRIDINE HCL 200 MG PO TABS: 200.0000 mg | ORAL_TABLET | Freq: Three times a day (TID) | ORAL | Status: DC

## 2016-01-21 MED ORDER — CEPHALEXIN 250 MG/5ML PO SUSR: 500.0000 mg | Freq: Four times a day (QID) | ORAL | Status: DC

## 2016-01-21 MED ORDER — SULFAMETHOXAZOLE-TRIMETHOPRIM 800-160 MG PO TABS: 1.0000 | ORAL_TABLET | Freq: Two times a day (BID) | ORAL | Status: AC

## 2016-01-21 MED ORDER — SODIUM CHLORIDE 0.9 % IV BOLUS (SEPSIS)
500.0000 mL | Freq: Once | INTRAVENOUS | Status: AC
  Administered 2016-01-21: 500 mL via INTRAVENOUS

## 2016-01-21 MED ORDER — DEXTROSE 5 % IV SOLN
1.0000 g | Freq: Once | INTRAVENOUS | Status: AC
  Administered 2016-01-21: 1 g via INTRAVENOUS
  Filled 2016-01-21: qty 10

## 2016-01-21 NOTE — ED Notes (Signed)
Patient states that she is having Urinary Frequency and burning with Urination x 2 days

## 2016-01-21 NOTE — ED Provider Notes (Signed)
CSN: [id number]     Arrival date & time 01/21/16  2109 History   First MD Initiated Contact with Patient 01/21/16 2120     Chief Complaint  Patient presents with  . Urinary Frequency      HPI Patient states that she is having Urinary Frequency and burning with Urination x 2 days Past Medical History  Diagnosis Date  . Skin cancer    Past Surgical History  Procedure Laterality Date  . Skin cancer remo    . Cataract extraction     History reviewed. No pertinent family history. Social History  Substance Use Topics  . Smoking status: Never Smoker   . Smokeless tobacco: None  . Alcohol Use: No   OB History    No data available     Review of Systems  All other systems reviewed and are negative.     Allergies  Codeine  Home Medications   Prior to Admission medications   Medication Sig Start Date End Date Taking? Authorizing Provider  omeprazole (PRILOSEC) 10 MG capsule Take 10 mg by mouth daily.    Historical Provider, MD  phenazopyridine (PYRIDIUM) 200 MG tablet Take 1 tablet (200 mg total) by mouth 3 (three) times daily. 01/21/16   Leonard Schwartz, MD  sulfamethoxazole-trimethoprim (BACTRIM DS,SEPTRA DS) 800-160 MG tablet Take 1 tablet by mouth 2 (two) times daily. 01/21/16 01/28/16  Leonard Schwartz, MD  Vitamin D, Ergocalciferol, (DRISDOL) 50000 UNITS CAPS capsule Take 50,000 Units by mouth every 7 (seven) days.    Historical Provider, MD  Zolpidem Tartrate (AMBIEN PO) Take by mouth.    Historical Provider, MD   BP 127/66 mmHg  Pulse 72  Temp(Src) 97.8 F (36.6 C) (Oral)  Resp 18  Ht 5\' 3"  (1.6 m)  Wt 105 lb (47.628 kg)  BMI 18.60 kg/m2  SpO2 100% Physical Exam  Constitutional: She is oriented to person, place, and time. She appears well-developed and well-nourished. No distress.  HENT:  Head: Normocephalic and atraumatic.  Eyes: Pupils are equal, round, and reactive to light.  Neck: Normal range of motion.  Cardiovascular: Normal rate and intact distal pulses.    Pulmonary/Chest: No respiratory distress.  Abdominal: Normal appearance. She exhibits no distension. There is no tenderness. There is no rebound.  Musculoskeletal: Normal range of motion.  Neurological: She is alert and oriented to person, place, and time. No cranial nerve deficit.  Skin: Skin is warm and dry. No rash noted.  Psychiatric: She has a normal mood and affect. Her behavior is normal.  Nursing note and vitals reviewed.   ED Course  Procedures (including critical care time) Medications  cefTRIAXone (ROCEPHIN) 1 g in dextrose 5 % 50 mL IVPB (not administered)  sodium chloride 0.9 % bolus 500 mL (0 mLs Intravenous Stopped 01/21/16 2239)    Labs Review Labs Reviewed  URINALYSIS, ROUTINE W REFLEX MICROSCOPIC (NOT AT [facility]) - Abnormal; Notable for the following:    APPearance TURBID (*)    Hgb urine dipstick LARGE (*)    Protein, ur 30 (*)    Leukocytes, UA LARGE (*)    All other components within normal limits  URINE MICROSCOPIC-ADD ON - Abnormal; Notable for the following:    Squamous Epithelial / LPF 6-30 (*)    Bacteria, UA MANY (*)    All other components within normal limits    I  MDM   Final diagnoses:  UTI (lower urinary tract infection)        Leonard Schwartz, MD 01/21/16 2310

## 2016-01-21 NOTE — ED Notes (Signed)
Pt given d/c instructions as per chart. Verbalizes understanding. No questions. Rx x 2. 

## 2016-01-21 NOTE — Discharge Instructions (Signed)

## 2016-02-28 DIAGNOSIS — H18411 Arcus senilis, right eye: Secondary | ICD-10-CM | POA: Diagnosis not present

## 2016-02-28 DIAGNOSIS — H2512 Age-related nuclear cataract, left eye: Secondary | ICD-10-CM | POA: Diagnosis not present

## 2016-02-28 DIAGNOSIS — H18412 Arcus senilis, left eye: Secondary | ICD-10-CM | POA: Diagnosis not present

## 2016-02-28 DIAGNOSIS — Z961 Presence of intraocular lens: Secondary | ICD-10-CM | POA: Diagnosis not present

## 2016-02-28 DIAGNOSIS — H18459 Nodular corneal degeneration, unspecified eye: Secondary | ICD-10-CM | POA: Diagnosis not present

## 2016-03-25 ENCOUNTER — Other Ambulatory Visit: Payer: Self-pay

## 2016-03-25 ENCOUNTER — Encounter (HOSPITAL_BASED_OUTPATIENT_CLINIC_OR_DEPARTMENT_OTHER): Payer: Self-pay | Admitting: Emergency Medicine

## 2016-03-25 ENCOUNTER — Emergency Department (HOSPITAL_BASED_OUTPATIENT_CLINIC_OR_DEPARTMENT_OTHER): Payer: BLUE CROSS/BLUE SHIELD

## 2016-03-25 ENCOUNTER — Emergency Department (HOSPITAL_BASED_OUTPATIENT_CLINIC_OR_DEPARTMENT_OTHER)
Admission: EM | Admit: 2016-03-25 | Discharge: 2016-03-25 | Disposition: A | Discharge status: Home / Self Care | Payer: BLUE CROSS/BLUE SHIELD | Attending: Emergency Medicine | Admitting: Emergency Medicine

## 2016-03-25 DIAGNOSIS — R251 Tremor, unspecified: Secondary | ICD-10-CM | POA: Insufficient documentation

## 2016-03-25 DIAGNOSIS — R1010 Upper abdominal pain, unspecified: Secondary | ICD-10-CM | POA: Diagnosis not present

## 2016-03-25 DIAGNOSIS — R531 Weakness: Secondary | ICD-10-CM | POA: Diagnosis not present

## 2016-03-25 LAB — CBC WITH DIFFERENTIAL/PLATELET
Basophils Absolute: 0 10*3/uL (ref 0.0–0.1)
Basophils Relative: 0 %
EOS ABS: 0.1 10*3/uL (ref 0.0–0.7)
EOS PCT: 1 %
HCT: 40.9 % (ref 36.0–46.0)
Hemoglobin: 14.2 g/dL (ref 12.0–15.0)
LYMPHS ABS: 3 10*3/uL (ref 0.7–4.0)
LYMPHS PCT: 37 %
MCH: 31.4 pg (ref 26.0–34.0)
MCHC: 34.7 g/dL (ref 30.0–36.0)
MCV: 90.5 fL (ref 78.0–100.0)
MONO ABS: 0.8 10*3/uL (ref 0.1–1.0)
Monocytes Relative: 10 %
Neutro Abs: 4.3 10*3/uL (ref 1.7–7.7)
Neutrophils Relative %: 52 %
PLATELETS: 207 10*3/uL (ref 150–400)
RBC: 4.52 MIL/uL (ref 3.87–5.11)
RDW: 13.5 % (ref 11.5–15.5)
WBC: 8.2 10*3/uL (ref 4.0–10.5)

## 2016-03-25 LAB — URINALYSIS, ROUTINE W REFLEX MICROSCOPIC
BILIRUBIN URINE: NEGATIVE
GLUCOSE, UA: NEGATIVE mg/dL
HGB URINE DIPSTICK: NEGATIVE
KETONES UR: NEGATIVE mg/dL
Leukocytes, UA: NEGATIVE
Nitrite: NEGATIVE
PH: 7 (ref 5.0–8.0)
PROTEIN: NEGATIVE mg/dL
Specific Gravity, Urine: 1.006 (ref 1.005–1.030)

## 2016-03-25 LAB — LIPASE, BLOOD: LIPASE: 19 U/L (ref 11–51)

## 2016-03-25 LAB — TROPONIN I: Troponin I: <0.03 ng/mL (ref <0.031)

## 2016-03-25 LAB — COMPREHENSIVE METABOLIC PANEL
ALK PHOS: 69 U/L (ref 38–126)
ALT: 12 U/L — AB (ref 14–54)
AST: 23 U/L (ref 15–41)
Albumin: 4 g/dL (ref 3.5–5.0)
Anion gap: 10 (ref 5–15)
BUN: 16 mg/dL (ref 6–20)
CALCIUM: 9 mg/dL (ref 8.9–10.3)
CO2: 26 mmol/L (ref 22–32)
CREATININE: 0.69 mg/dL (ref 0.44–1.00)
Chloride: 103 mmol/L (ref 101–111)
Glucose, Bld: 91 mg/dL (ref 65–99)
Potassium: 3.5 mmol/L (ref 3.5–5.1)
SODIUM: 139 mmol/L (ref 135–145)
Total Bilirubin: 0.9 mg/dL (ref 0.3–1.2)
Total Protein: 7 g/dL (ref 6.5–8.1)

## 2016-03-25 MED ORDER — SODIUM CHLORIDE 0.9 % IV BOLUS (SEPSIS)
1000.0000 mL | Freq: Once | INTRAVENOUS | Status: AC
  Administered 2016-03-25: 1000 mL via INTRAVENOUS

## 2016-03-25 NOTE — ED Notes (Signed)
Pt able to ambulate to restroom x1 min assist for balance, States she is still weak but feels better than before.

## 2016-03-25 NOTE — ED Provider Notes (Signed)
CSN: [id number]     Arrival date & time 03/25/16  1418 History  By signing my name below, I, Reola Mosher, attest that this documentation has been prepared under the direction and in the presence of Sherwood Gambler, MD.  Electronically Signed: Reola Mosher, ED Scribe. 03/25/2016. 3:47 PM.   Chief Complaint  Patient presents with  . Weakness   The history is provided by the patient. No language interpreter was used.   HPI Comments: Gwendolyn Bright is a 75 y.o. female who presents to the Emergency Department complaining of generalized malaise,and diffuse weakness onset two days ago. She has associated loss of appetite and intermittent, 5/10 right-sided upper abdominal pain. Her husband notes that she tried lifting a heavy box two days ago, and just after that she noticed her abdominal pain. Pt reports that she has been very nervous for the past three to four days, and it has been exacerbated because she has been thinking of her son's death which occurred a few years ago. She reports that during her episodes she has trembling and begins to cry. The onset of her symptoms are more aggravated during her nervous episodes. Pt reports that has a cataract surgery on 04/16/16 and 04/23/16 and she is also nervous about those. She has not been taking any new medications recently. She denies drinking alcohol or smoking at all. PT has a PSHx of an appendectomy. Pt denies dizziness, HA, vomiting, diarrhea, CP, SOB, dysuria, trouble urinating, neck pain, fevers, or back pain. She denies SI.   Past Medical History  Diagnosis Date  . Skin cancer    Past Surgical History  Procedure Laterality Date  . Skin cancer remo    . Cataract extraction     History reviewed. No pertinent family history. Social History  Substance Use Topics  . Smoking status: Never Smoker   . Smokeless tobacco: None  . Alcohol Use: No   OB History    No data available     Review of Systems  Constitutional: Positive for  appetite change. Negative for fever.  Respiratory: Negative for shortness of breath.   Cardiovascular: Negative for chest pain.  Gastrointestinal: Negative for vomiting and diarrhea.  Genitourinary: Negative for dysuria and difficulty urinating.  Musculoskeletal: Negative for back pain and neck pain.  Neurological: Positive for weakness. Negative for headaches.  Psychiatric/Behavioral: The patient is nervous/anxious.   All other systems reviewed and are negative.  Allergies  Codeine  Home Medications   Prior to Admission medications   Medication Sig Start Date End Date Taking? Authorizing Provider  cephALEXin (KEFLEX) 250 MG/5ML suspension Take 10 mLs (500 mg total) by mouth 4 (four) times daily. 01/21/16   Leonard Schwartz, MD  omeprazole (PRILOSEC) 10 MG capsule Take 10 mg by mouth daily.    Historical Provider, MD  phenazopyridine (PYRIDIUM) 200 MG tablet Take 1 tablet (200 mg total) by mouth 3 (three) times daily. 01/21/16   Leonard Schwartz, MD  Vitamin D, Ergocalciferol, (DRISDOL) 50000 UNITS CAPS capsule Take 50,000 Units by mouth every 7 (seven) days.    Historical Provider, MD  Zolpidem Tartrate (AMBIEN PO) Take by mouth.    Historical Provider, MD   BP 133/75 mmHg  Pulse 74  Temp(Src) 97.8 F (36.6 C) (Oral)  Resp 16  Ht 5\' 2"  (1.575 m)  Wt 105 lb (47.628 kg)  BMI 19.20 kg/m2  SpO2 100%   Physical Exam  Constitutional: She is oriented to person, place, and time. She appears well-developed and  well-nourished.  HENT:  Head: Normocephalic and atraumatic.  Right Ear: External ear normal.  Left Ear: External ear normal.  Nose: Nose normal.  Eyes: EOM are normal. Pupils are equal, round, and reactive to light. Right eye exhibits no discharge. Left eye exhibits no discharge.  Neck: Neck supple.  Cardiovascular: Normal rate, regular rhythm and normal heart sounds.   No murmur heard. Pulmonary/Chest: Effort normal and breath sounds normal.  Abdominal: Soft. There is tenderness  in the right upper quadrant.  Musculoskeletal: She exhibits no edema.  Neurological: She is alert and oriented to person, place, and time.  Reflex Scores:      Bicep reflexes are 2+ on the right side and 2+ on the left side.      Patellar reflexes are 2+ on the right side and 2+ on the left side. Cranial nerves 3-12 grossly intact. She exhibits diffuse weakness in all 4 extremities but technically are still 5/5. Pt is able to ambulate with a slow gait.   Skin: Skin is warm and dry.  Nursing note and vitals reviewed.  ED Course  Procedures (including critical care time) DIAGNOSTIC STUDIES: Oxygen Saturation is 100% on RA, normal by my interpretation.   COORDINATION OF CARE: 3:46 PM-Discussed next steps with pt including CT Head and US abdomen. Pt verbalized understanding and is agreeable with the plan.   Labs Review Labs Reviewed  COMPREHENSIVE METABOLIC PANEL - Abnormal; Notable for the following:    ALT 12 (*)    All other components within normal limits  TROPONIN I  CBC WITH DIFFERENTIAL/PLATELET  LIPASE, BLOOD  URINALYSIS, ROUTINE W REFLEX MICROSCOPIC (NOT AT [facility])    Imaging Review Ct Head Wo Contrast  03/25/2016  CLINICAL DATA:  Weakness, tremors and decreased appetite. EXAM: CT HEAD WITHOUT CONTRAST TECHNIQUE: Contiguous axial images were obtained from the base of the skull through the vertex without intravenous contrast. COMPARISON:  08/29/2015 FINDINGS: Stable age related cerebral atrophy, ventriculomegaly and periventricular white matter disease. No extra-axial fluid collections are identified. No CT findings for acute hemispheric infarction or intracranial hemorrhage. No mass lesions. The brainstem and cerebellum are normal. The bony structures are unremarkable. The paranasal sinuses and mastoid air cells are clear. The globes are intact. IMPRESSION: Stable age related cerebral atrophy, ventriculomegaly and periventricular white matter disease. No acute intracranial  findings or mass lesions. Electronically Signed   By: Marijo Sanes M.D.   On: 03/25/2016 16:50   US Abdomen Limited  03/25/2016  CLINICAL DATA:  Upper abdominal pain for 2 days, malleolus. EXAM: US ABDOMEN LIMITED - RIGHT UPPER QUADRANT COMPARISON:  None. FINDINGS: Gallbladder: No gallstones seen. There is no gallbladder wall thickening, pericholecystic fluid or other secondary sonographic signs of acute cholecystitis. Common bile duct: Diameter: Normal at 2.4 mm Liver: No focal lesion identified. Within normal limits in parenchymal echogenicity. IMPRESSION: Normal right upper quadrant ultrasound. Electronically Signed   By: Franki Cabot M.D.   On: 03/25/2016 16:53    I have personally reviewed and evaluated these images and lab results as part of my medical decision-making.   EKG Interpretation  Date/Time:  Sunday March 25 2016 16:02:54 EDT Ventricular Rate:  60 PR Interval:    QRS Duration: 78 QT Interval:  413 QTC Calculation: 413 R Axis:   75 Text Interpretation:  Sinus rhythm Anteroseptal infarct, age indeterminate no significant change since Nov 2016 Confirmed by Regenia Skeeter MD, Maleena Eddleman [phone]) on 03/25/2016 4:15:19 PM       MDM   Final diagnoses:  Generalized weakness  Tremor    No obvious exhalation for patient's generalized weakness. She is able to get up and ambulate, it seems to be more effort than should be typical however she can walk without significant assistance. With negative workup currently in the ED I do not think she needs admission or other emergent workup. Will recommend she follow-up with PCP and will give neurology referral given weakness and tremors. However no focal findings. No infectious symptoms. Unclear why she's having abdominal pain but she has had this before with no clear etiology. LFTs/lipase normal. Ultrasound unremarkable. Discussed return precautions.  I personally performed the services described in this documentation, which was scribed in my  presence. The recorded information has been reviewed and is accurate.    Sherwood Gambler, MD 03/25/16 [phone]

## 2016-03-25 NOTE — ED Notes (Signed)
Pt and husband given d/c instructions as per chart. Verbalizes understanding. No questions. 

## 2016-03-25 NOTE — ED Notes (Signed)
Patient states that she has had been "sick" over the last couple of days. The patient reports that she is not currently in pain but reports that she is very anxious. Patient is tearful and having tremors. Patient was unable to stand to get out of the car on arrival

## 2016-04-07 DIAGNOSIS — R634 Abnormal weight loss: Secondary | ICD-10-CM | POA: Diagnosis not present

## 2016-04-07 DIAGNOSIS — R531 Weakness: Secondary | ICD-10-CM | POA: Diagnosis not present

## 2016-04-16 DIAGNOSIS — H2512 Age-related nuclear cataract, left eye: Secondary | ICD-10-CM | POA: Diagnosis not present

## 2016-08-07 ENCOUNTER — Encounter: Payer: Self-pay | Admitting: Family

## 2016-08-07 ENCOUNTER — Other Ambulatory Visit (INDEPENDENT_AMBULATORY_CARE_PROVIDER_SITE_OTHER): Payer: Medicare Other

## 2016-08-07 ENCOUNTER — Ambulatory Visit (INDEPENDENT_AMBULATORY_CARE_PROVIDER_SITE_OTHER): Payer: BLUE CROSS/BLUE SHIELD | Admitting: Family

## 2016-08-07 VITALS — BP 122/70 | HR 58 | Temp 97.8°F | Resp 14 | Ht 62.0 in | Wt 94.0 lb

## 2016-08-07 DIAGNOSIS — R634 Abnormal weight loss: Secondary | ICD-10-CM

## 2016-08-07 DIAGNOSIS — R636 Underweight: Secondary | ICD-10-CM | POA: Diagnosis not present

## 2016-08-07 LAB — CBC WITH DIFFERENTIAL/PLATELET
BASOS ABS: 0 10*3/uL (ref 0.0–0.1)
Basophils Relative: 0.5 % (ref 0.0–3.0)
Eosinophils Absolute: 0.3 10*3/uL (ref 0.0–0.7)
Eosinophils Relative: 3.2 % (ref 0.0–5.0)
HEMATOCRIT: 44.5 % (ref 36.0–46.0)
HEMOGLOBIN: 14.8 g/dL (ref 12.0–15.0)
LYMPHS PCT: 44.7 % (ref 12.0–46.0)
Lymphs Abs: 3.5 10*3/uL (ref 0.7–4.0)
MCHC: 33.2 g/dL (ref 30.0–36.0)
MCV: 90.5 fl (ref 78.0–100.0)
MONOS PCT: 11.4 % (ref 3.0–12.0)
Monocytes Absolute: 0.9 10*3/uL (ref 0.1–1.0)
NEUTROS ABS: 3.2 10*3/uL (ref 1.4–7.7)
Neutrophils Relative %: 40.2 % — ABNORMAL LOW (ref 43.0–77.0)
PLATELETS: 218 10*3/uL (ref 150.0–400.0)
RBC: 4.92 Mil/uL (ref 3.87–5.11)
RDW: 13.5 % (ref 11.5–15.5)
WBC: 7.8 10*3/uL (ref 4.0–10.5)

## 2016-08-07 LAB — TSH: TSH: 1.49 u[IU]/mL (ref 0.35–4.50)

## 2016-08-07 LAB — SEDIMENTATION RATE: Sed Rate: 24 mm/hr (ref 0–30)

## 2016-08-07 LAB — HEMOGLOBIN A1C: HEMOGLOBIN A1C: 6 % (ref 4.6–6.5)

## 2016-08-07 NOTE — Patient Instructions (Signed)
Thank you for choosing [facility].  SUMMARY AND INSTRUCTIONS:  Medication:  Please continue to take your medications as prescribed.   Eat frequent small meals of high caloric dense foods.  Boost/Ensure meal supplements.   Labs:  Please stop by the lab on the lower level of the building for your blood work. Your results will be released to [facility] (or called to you) after review, usually within 72 hours after test completion. If any changes need to be made, you will be notified at that same time.  1.) The lab is open from 7:30am to 5:30 pm Monday-Friday 2.) No appointment is necessary 3.) Fasting (if needed) is 6-8 hours after food and drink; black coffee and water are okay   Follow up:  If your symptoms worsen or fail to improve, please contact our office for further instruction, or in case of emergency go directly to the emergency room at the closest medical facility.

## 2016-08-07 NOTE — Progress Notes (Signed)
Subjective:    Patient ID: Gwendolyn Bright, female    DOB: [birth year], 75 y.o.   MRN: [id number]  Chief Complaint  Patient presents with  . Establish Care    x3 days, fatigue, weight loss, constipation, loss of appetite    HPI:  Gwendolyn Bright is a 75 y.o. female who  has a past medical history of Skin cancer. and presents today for an office visit to establish care.   This is a new problem. Associated symptoms of fatigue, weight loss, constipation and loss of appetite have been going on for 3 days. Notes that she has lost a total of about 16 pounds over the past 4 months. No fevers. Denies any nausea, vomiting or diarrhea. Endorses some constipation which was relieved by Exlax. Currently eating about 3 meals per day including a sandwich at lunchtime.   Allergies  Allergen Reactions  . Codeine Nausea And Vomiting      Outpatient Medications Prior to Visit  Medication Sig Dispense Refill  . omeprazole (PRILOSEC) 10 MG capsule Take 10 mg by mouth daily.    . phenazopyridine (PYRIDIUM) 200 MG tablet Take 1 tablet (200 mg total) by mouth 3 (three) times daily. 6 tablet 0  . Vitamin D, Ergocalciferol, (DRISDOL) 50000 UNITS CAPS capsule Take 50,000 Units by mouth every 7 (seven) days.    . Zolpidem Tartrate (AMBIEN PO) Take by mouth.    . cephALEXin (KEFLEX) 250 MG/5ML suspension Take 10 mLs (500 mg total) by mouth 4 (four) times daily. 200 mL 0   No facility-administered medications prior to visit.       Past Surgical History:  Procedure Laterality Date  . APPENDECTOMY    . CATARACT EXTRACTION    . skin cancer remo       Past Medical History:  Diagnosis Date  . Skin cancer       Review of Systems  Constitutional: Positive for appetite change and fatigue. Negative for chills and fever.  Respiratory: Negative for chest tightness and shortness of breath.   Cardiovascular: Negative for chest pain, palpitations and leg swelling.  Gastrointestinal: Negative for abdominal  pain, constipation, diarrhea, nausea and vomiting.  Neurological: Positive for dizziness and weakness. Negative for syncope.      Objective:    BP 122/70 (BP Location: Left Arm, Patient Position: Sitting, Cuff Size: Normal)   Pulse (!) 58   Temp 97.8 F (36.6 C) (Oral)   Resp 14   Ht '5\' 2"'  (1.575 m)   Wt 94 lb (42.6 kg)   SpO2 92%   BMI 17.19 kg/m  Nursing note and vital signs reviewed.  Physical Exam  Constitutional: She is oriented to person, place, and time. She appears well-developed and well-nourished. She appears cachectic. She does not appear ill. No distress.  Cardiovascular: Normal rate, regular rhythm, normal heart sounds and intact distal pulses.   Pulmonary/Chest: Effort normal and breath sounds normal.  Neurological: She is alert and oriented to person, place, and time.  Skin: Skin is warm and dry.  Psychiatric: She has a normal mood and affect. Her behavior is normal. Judgment and thought content normal.       Assessment & Plan:   Problem List Items Addressed This Visit      Other   Loss of weight    Loss of weight appears to be related to decrease caloric intake. Patient indicates all cancer screenings are up to date per recommendations with no significant findings. Obtain CBC with differential, A1c,  Hepatitis C, HIV, ESR and TSH. Encouraged to consume frequent small meals that are calorically dense. Start Ensure/Boost daily. Continue to monitor weight pending lab work results. Follow up in 1 month or sooner for weight check.       Relevant Orders   Hemoglobin A1c (Completed)   TSH (Completed)   CBC w/Diff (Completed)   Sed Rate (ESR) (Completed)   Hepatitis C antibody   HIV antibody (with reflex)   Underweight       I have discontinued Ms. Puebla's Vitamin D (Ergocalciferol), omeprazole, Zolpidem Tartrate (AMBIEN PO), phenazopyridine, and cephALEXin. I am also having her maintain her zolpidem.   Meds ordered this encounter  Medications  . zolpidem  (AMBIEN) 5 MG tablet    Sig: Take 5 mg by mouth at bedtime as needed for sleep.     Follow-up: Return if symptoms worsen or fail to improve.  Mauricio Po, FNP

## 2016-08-07 NOTE — Assessment & Plan Note (Signed)
Loss of weight appears to be related to decrease caloric intake. Patient indicates all cancer screenings are up to date per recommendations with no significant findings. Obtain CBC with differential, A1c, Hepatitis C, HIV, ESR and TSH. Encouraged to consume frequent small meals that are calorically dense. Start Ensure/Boost daily. Continue to monitor weight pending lab work results. Follow up in 1 month or sooner for weight check.

## 2016-08-08 LAB — HEPATITIS C ANTIBODY: HCV Ab: NEGATIVE

## 2016-08-08 LAB — HIV ANTIBODY (ROUTINE TESTING W REFLEX): HIV: NONREACTIVE

## 2016-09-13 ENCOUNTER — Encounter: Payer: Self-pay | Admitting: Family

## 2016-09-13 ENCOUNTER — Ambulatory Visit (INDEPENDENT_AMBULATORY_CARE_PROVIDER_SITE_OTHER): Payer: BLUE CROSS/BLUE SHIELD | Admitting: Family

## 2016-09-13 ENCOUNTER — Other Ambulatory Visit: Payer: Self-pay

## 2016-09-13 VITALS — BP 112/64 | HR 88 | Temp 98.2°F | Resp 20 | Wt 90.0 lb

## 2016-09-13 DIAGNOSIS — F419 Anxiety disorder, unspecified: Secondary | ICD-10-CM

## 2016-09-13 DIAGNOSIS — R634 Abnormal weight loss: Secondary | ICD-10-CM | POA: Diagnosis not present

## 2016-09-13 DIAGNOSIS — F418 Other specified anxiety disorders: Secondary | ICD-10-CM | POA: Diagnosis not present

## 2016-09-13 DIAGNOSIS — F32A Depression, unspecified: Secondary | ICD-10-CM | POA: Insufficient documentation

## 2016-09-13 DIAGNOSIS — F329 Major depressive disorder, single episode, unspecified: Secondary | ICD-10-CM | POA: Insufficient documentation

## 2016-09-13 MED ORDER — ZOLPIDEM TARTRATE ER 6.25 MG PO TBCR: 6.2500 mg | EXTENDED_RELEASE_TABLET | Freq: Every evening | ORAL | 0 refills | Status: DC | PRN

## 2016-09-13 MED ORDER — PAROXETINE HCL 20 MG PO TABS
20.0000 mg | ORAL_TABLET | Freq: Every day | ORAL | 0 refills | Status: DC
Start: 2016-09-13 — End: 2017-02-22

## 2016-09-13 NOTE — Progress Notes (Signed)
Pre visit review using our clinic review tool, if applicable. No additional management support is needed unless otherwise documented below in the visit note. 

## 2016-09-13 NOTE — Assessment & Plan Note (Signed)
Continues to lose weight with decreased oral intake with concern for possible malignancy. Obtain CT of the abdomen/pelvis. Continue to eat small, frequent meals. Paroxetine has side effect of weight gain which may be beneficial. Cannot rule out depression. Sample of Boost/Ensure provided. Consider possible referral pending CT results.

## 2016-09-13 NOTE — Assessment & Plan Note (Addendum)
Does appear slightly depressed and reiterates that she has a touch of nerves lately which would be a new problem. Having some problems sleeping as well. Depression and anxiety could be a contributing factor in her decreased appetite. Start paroxetine. Denies suicidal ideations. Change Ambein to Ambien CR.  Follow up in 1 month or sooner if needed.

## 2016-09-13 NOTE — Patient Instructions (Signed)
Thank you for choosing [facility].  SUMMARY AND INSTRUCTIONS:  Continue to eat small frequent meals.   Medication:  Please start taking Paxil.  Start the controlled release Ambien.   Your prescription(s) have been submitted to your pharmacy or been printed and provided for you. Please take as directed and contact our office if you believe you are having problem(s) with the medication(s) or have any questions.  Imaging / Radiology:  They will call to schedule your CT scan.    Follow up:  If your symptoms worsen or fail to improve, please contact our office for further instruction, or in case of emergency go directly to the emergency room at the closest medical facility.

## 2016-09-13 NOTE — Progress Notes (Signed)
Subjective:    Patient ID: Gwendolyn Bright, female    DOB: [birth year], 75 y.o.   MRN: [id number]  Chief Complaint  Patient presents with  . Weight Loss  . Anxiety  . Stress  . Depression    HPI:  Gwendolyn Bright is a 75 y.o. female who  has a past medical history of Skin cancer. and presents today for an a follow up office visit.   Recently evaluated in the office with concern for loss of weight with the recommendations of eating small, frequent meals and adding a meal supplement. Reports that she is consuming about 1 Boost/Ensure per day. Husband indicates that she eats about 25% of her meals. Husband indicates that he has concerns for depression and anxiety related to her son's death about 5 years ago. Severity of her symptoms is making it difficult for her to walk. She did have a fall about 2 weeks ago at work.   Allergies  Allergen Reactions  . Codeine Nausea And Vomiting      Outpatient Medications Prior to Visit  Medication Sig Dispense Refill  . zolpidem (AMBIEN) 5 MG tablet Take 5 mg by mouth at bedtime as needed for sleep.     No facility-administered medications prior to visit.       Past Surgical History:  Procedure Laterality Date  . APPENDECTOMY    . CATARACT EXTRACTION    . skin cancer remo        Past Medical History:  Diagnosis Date  . Skin cancer       Review of Systems  Constitutional: Positive for appetite change and fatigue. Negative for chills and fever.  Respiratory: Negative for chest tightness and shortness of breath.   Cardiovascular: Negative for chest pain, palpitations and leg swelling.  Gastrointestinal: Positive for constipation. Negative for abdominal pain, diarrhea, nausea and vomiting.  Neurological: Positive for weakness. Negative for dizziness, syncope and numbness.      Objective:    BP 112/64   Pulse 88   Temp 98.2 F (36.8 C) (Oral)   Resp 20   Wt 90 lb (40.8 kg)   SpO2 95%   BMI 16.46 kg/m  Nursing note and  vital signs reviewed.  Wt Readings from Last 3 Encounters:  09/13/16 90 lb (40.8 kg)  08/07/16 94 lb (42.6 kg)  03/25/16 105 lb (47.6 kg)    Physical Exam  Constitutional: She is oriented to person, place, and time. She appears well-developed and well-nourished. No distress.  Cardiovascular: Normal rate, regular rhythm, normal heart sounds and intact distal pulses.   Pulmonary/Chest: Effort normal and breath sounds normal. No respiratory distress. She has no wheezes. She has no rales. She exhibits no tenderness.  Abdominal: Soft. Bowel sounds are normal. She exhibits no distension and no mass. There is no tenderness. There is no rebound and no guarding.  Neurological: She is alert and oriented to person, place, and time.  Skin: Skin is warm and dry.  Psychiatric: She has a normal mood and affect. Her behavior is normal. Judgment and thought content normal.       Assessment & Plan:   Problem List Items Addressed This Visit      Other   Loss of weight - Primary    Continues to lose weight with decreased oral intake with concern for possible malignancy. Obtain CT of the abdomen/pelvis. Continue to eat small, frequent meals. Paroxetine has side effect of weight gain which may be beneficial. Cannot rule out depression.  Sample of Boost/Ensure provided. Consider possible referral pending CT results.       Relevant Orders   CT ABDOMEN PELVIS W WO CONTRAST   Anxiety and depression    Does appear slightly depressed and reiterates that she has a touch of nerves lately which would be a new problem. Having some problems sleeping as well. Depression and anxiety could be a contributing factor in her decreased appetite. Start paroxetine. Denies suicidal ideations. Change Ambein to Ambien CR.  Follow up in 1 month or sooner if needed.       Relevant Medications   PARoxetine (PAXIL) 20 MG tablet       I have discontinued Ms. Ander's zolpidem. I am also having her start on zolpidem and  PARoxetine.   Meds ordered this encounter  Medications  . zolpidem (AMBIEN CR) 6.25 MG CR tablet    Sig: Take 1 tablet (6.25 mg total) by mouth at bedtime as needed for sleep.    Dispense:  30 tablet    Refill:  0    Order Specific Question:   Supervising Provider    Answer:   Pricilla Holm A [id number]  . PARoxetine (PAXIL) 20 MG tablet    Sig: Take 1 tablet (20 mg total) by mouth daily.    Dispense:  30 tablet    Refill:  0    Order Specific Question:   Supervising Provider    Answer:   Pricilla Holm A [id number]     Follow-up: Return in about 1 month (around 10/14/2016), or if symptoms worsen or fail to improve.  Mauricio Po, FNP

## 2016-09-14 ENCOUNTER — Other Ambulatory Visit: Payer: Medicare Other

## 2016-09-17 ENCOUNTER — Other Ambulatory Visit: Payer: Self-pay | Admitting: Family

## 2016-09-17 ENCOUNTER — Telehealth: Payer: Self-pay | Admitting: Family

## 2016-09-17 MED ORDER — ZOLPIDEM TARTRATE ER 6.25 MG PO TBCR: 6.2500 mg | EXTENDED_RELEASE_TABLET | Freq: Every evening | ORAL | 2 refills | Status: DC | PRN

## 2016-09-17 NOTE — Telephone Encounter (Signed)
Pt husband called in and said that the [facility] did not get the zolpidem (AMBIEN CR) 6.25 MG CR tablet [id number] Can you call that back in?

## 2016-09-17 NOTE — Telephone Encounter (Signed)
Rx sent 

## 2016-09-19 ENCOUNTER — Telehealth: Payer: Self-pay | Admitting: Family

## 2016-09-19 NOTE — Telephone Encounter (Signed)
Rec'd PA for the zolpidem completed on cover-my-meds. Med has been approved w/case ID [id number] starting dates 09/19/16-09/19/17. Called [facility] spoke w/Jaffi gave approval status...Gwendolyn Bright

## 2016-09-19 NOTE — Telephone Encounter (Signed)
Noted! Thank you

## 2016-09-19 NOTE — Telephone Encounter (Signed)
FYI - Pt's husband cancelled her CT on 12/14. I spoke with pt and advised her that you were concerned and to have  the CT done. I asked that she call [facility] to reschedule. She stated she will let her husband know.

## 2016-09-28 ENCOUNTER — Emergency Department (HOSPITAL_BASED_OUTPATIENT_CLINIC_OR_DEPARTMENT_OTHER)
Admission: EM | Admit: 2016-09-28 | Discharge: 2016-09-28 | Disposition: A | Discharge status: Home / Self Care | Payer: BLUE CROSS/BLUE SHIELD | Attending: Emergency Medicine | Admitting: Emergency Medicine

## 2016-09-28 ENCOUNTER — Encounter (HOSPITAL_BASED_OUTPATIENT_CLINIC_OR_DEPARTMENT_OTHER): Payer: Self-pay | Admitting: *Deleted

## 2016-09-28 ENCOUNTER — Emergency Department (HOSPITAL_BASED_OUTPATIENT_CLINIC_OR_DEPARTMENT_OTHER): Payer: BLUE CROSS/BLUE SHIELD

## 2016-09-28 DIAGNOSIS — M79641 Pain in right hand: Secondary | ICD-10-CM | POA: Diagnosis not present

## 2016-09-28 DIAGNOSIS — S6991XA Unspecified injury of right wrist, hand and finger(s), initial encounter: Secondary | ICD-10-CM | POA: Diagnosis present

## 2016-09-28 DIAGNOSIS — Y9389 Activity, other specified: Secondary | ICD-10-CM | POA: Diagnosis not present

## 2016-09-28 DIAGNOSIS — Z79899 Other long term (current) drug therapy: Secondary | ICD-10-CM | POA: Diagnosis not present

## 2016-09-28 DIAGNOSIS — S60221A Contusion of right hand, initial encounter: Secondary | ICD-10-CM | POA: Diagnosis not present

## 2016-09-28 DIAGNOSIS — Y999 Unspecified external cause status: Secondary | ICD-10-CM | POA: Diagnosis not present

## 2016-09-28 DIAGNOSIS — Z85828 Personal history of other malignant neoplasm of skin: Secondary | ICD-10-CM | POA: Insufficient documentation

## 2016-09-28 DIAGNOSIS — X501XXA Overexertion from prolonged static or awkward postures, initial encounter: Secondary | ICD-10-CM | POA: Diagnosis not present

## 2016-09-28 DIAGNOSIS — Y929 Unspecified place or not applicable: Secondary | ICD-10-CM | POA: Insufficient documentation

## 2016-09-28 MED ORDER — IBUPROFEN 400 MG PO TABS
600.0000 mg | ORAL_TABLET | Freq: Once | ORAL | Status: AC
  Administered 2016-09-28: 600 mg via ORAL
  Filled 2016-09-28: qty 1

## 2016-09-28 NOTE — ED Triage Notes (Signed)
Pain in her right hand while she was attempting to pick up a glass. Point tenderness to her thumb.

## 2016-09-28 NOTE — ED Provider Notes (Signed)
[facility] Provider Note   CSN: [id number] Arrival date & time: 09/28/16  1303     History   Chief Complaint Chief Complaint  Patient presents with  . Hand Pain    HPI Gwendolyn Bright is a 75 y.o. female.  75 year old Caucasian female with no significant past medical history presents to the ED today for pain in her right thumb. Patient states that yesterday she was attempting to pick up a drinking glass and had sudden onset of pain in her right thumb. Patient states that the pain slightly improved yesterday but worsened after she began moving it. She endorses mild ecchymosis of the area. She has tried ibuprofen with some relief at home. Moving makes the pain worse. Nothing makes the pain better. She denies any fever, chills, erythema or warmth to the right thumb or any other associated symptoms. Denies any bite.   The history is provided by the patient.  Hand Pain     Past Medical History:  Diagnosis Date  . Skin cancer     Patient Active Problem List   Diagnosis Date Noted  . Anxiety and depression 09/13/2016  . Loss of weight 08/07/2016  . Underweight 08/07/2016    Past Surgical History:  Procedure Laterality Date  . APPENDECTOMY    . CATARACT EXTRACTION    . skin cancer remo      OB History    No data available       Home Medications    Prior to Admission medications   Medication Sig Start Date End Date Taking? Authorizing Provider  PARoxetine (PAXIL) 20 MG tablet Take 1 tablet (20 mg total) by mouth daily. 09/13/16   Golden Circle, FNP  zolpidem (AMBIEN CR) 6.25 MG CR tablet Take 1 tablet (6.25 mg total) by mouth at bedtime as needed for sleep. 09/17/16   Golden Circle, FNP    Family History Family History  Problem Relation Age of Onset  . Tuberculosis Mother   . Alcohol abuse Father     Social History Social History  Substance Use Topics  . Smoking status: Never Smoker  . Smokeless tobacco: Never Used  . Alcohol use No      Allergies   Codeine   Review of Systems Review of Systems  Constitutional: Negative for chills and fever.  Musculoskeletal: Positive for arthralgias and myalgias. Negative for joint swelling.  Skin: Positive for color change.  All other systems reviewed and are negative.    Physical Exam Updated Vital Signs BP 119/68   Pulse 62   Temp 97.7 F (36.5 C) (Oral)   Resp 16   Ht 5\' 2"  (1.575 m)   Wt 40.8 kg   SpO2 100%   BMI 16.46 kg/m   Physical Exam  Constitutional: She appears well-developed and well-nourished. No distress.  Eyes: Right eye exhibits no discharge. Left eye exhibits no discharge. No scleral icterus.  Cardiovascular: Intact distal pulses.   Pulmonary/Chest: No respiratory distress.  Musculoskeletal: Normal range of motion.  Patient with tenderness between the first and second metacarpals with ecchymosis. No edema, erythema, warmth noted. Patient with full range of motion. Patient does have opposition of the thumb along with abduction and abduction. Negative Finkelstein test. Cap refill is normal. Sensation intact. Radial pulses are 2+ bilaterally. Full range of motion the right wrist without any pain. No scaphoid tenderness. No signs of bite wound.  Neurological: She is alert.  Skin: Skin is warm and dry. Capillary refill takes less than  2 seconds. No pallor.  Nursing note and vitals reviewed.    ED Treatments / Results  Labs (all labs ordered are listed, but only abnormal results are displayed) Labs Reviewed - No data to display  EKG  EKG Interpretation None       Radiology Dg Hand Complete Right  Result Date: 09/28/2016 CLINICAL DATA:  Pain after picking up something last night with pop in the hand. Pain is at the first and second digit. Initial encounter. EXAM: RIGHT HAND - COMPLETE 3+ VIEW COMPARISON:  None. FINDINGS: No acute fracture or malalignment. There is confluent cloudlike calcification at the lateral wrist without erosive changes.  No calcification of the TFCC. Chronic deformity of the little digit which is likely posttraumatic. Osteopenia. IMPRESSION: 1. No acute finding. 2. Calcification at the lateral wrist which could be tendinous or in a ganglion. Correlate for hyperparathyroidism or gout history. Electronically Signed   By: Monte Fantasia M.D.   On: 09/28/2016 13:42    Procedures Procedures (including critical care time)  Medications Ordered in ED Medications  ibuprofen (ADVIL,MOTRIN) tablet 600 mg (not administered)     Initial Impression / Assessment and Plan / ED Course  I have reviewed the triage vital signs and the nursing notes.  Pertinent labs & imaging results that were available during my care of the patient were reviewed by me and considered in my medical decision making (see chart for details).  Clinical Course   Patient X-Ray negative for obvious fracture or dislocation. Patient is neurovascularly intact. Full range of motion. X-ray shows cystlike region of the wrist. Patient denies any pain of the wrist. No history of gout or hyperparathyroidism. Likely tendinitis. Pt advised to follow up with pcp if symptoms persist for possibility of missed fracture diagnosis. Patient given brace while in ED, conservative therapy recommended and discussed including ibuprofen/Tylenol and icing, resting, elevating the right hand. Patient will be dc home & is agreeable with above plan.    Final Clinical Impressions(s) / ED Diagnoses   Final diagnoses:  Right hand pain    New Prescriptions New Prescriptions   No medications on file     Doristine Devoid, PA-C 09/28/16 1405    Virgel Manifold, MD 10/04/16 1253

## 2016-09-28 NOTE — ED Notes (Signed)
ED Provider at bedside. 

## 2016-09-28 NOTE — Discharge Instructions (Signed)
X-ray shows no signs of fracture. This could be a tendinitis. Please keep the wrist splint on for comfort. Please rest, ice, elevate your right hand. You may use Tylenol and Motrin for pain. Please follow-up with her primary care doctor next week if your symptoms are not improving for further management and possible referral to orthopedist. Return to the ED if you develops any worsening symptoms.

## 2017-01-29 DIAGNOSIS — N390 Urinary tract infection, site not specified: Secondary | ICD-10-CM

## 2017-01-29 HISTORY — DX: Urinary tract infection, site not specified: N39.0

## 2017-02-04 ENCOUNTER — Telehealth: Payer: Self-pay | Admitting: Family

## 2017-02-04 NOTE — Telephone Encounter (Signed)
Called patient to schedule awv. Pt stated that she is not interested in scheduling appt at this time.

## 2017-02-19 ENCOUNTER — Telehealth: Payer: Self-pay | Admitting: Family

## 2017-02-19 NOTE — Telephone Encounter (Signed)
Ok to schedule transfer visit June or later

## 2017-02-19 NOTE — Telephone Encounter (Signed)
Pt would like to transfer from [facility] to [facility] since her husband is seen by Sharlet Salina.

## 2017-02-19 NOTE — Telephone Encounter (Signed)
Ok with me 

## 2017-02-20 ENCOUNTER — Telehealth: Payer: Self-pay | Admitting: Family

## 2017-02-20 NOTE — Telephone Encounter (Signed)
Pt would like transfer from [facility] to [facility].  She would just like a female Doctor and her husband is a pt of Dr Sharlet Salina

## 2017-02-20 NOTE — Telephone Encounter (Signed)
OK with me.

## 2017-02-21 ENCOUNTER — Emergency Department (HOSPITAL_COMMUNITY): Payer: Medicare HMO

## 2017-02-21 ENCOUNTER — Encounter (HOSPITAL_COMMUNITY): Payer: Self-pay

## 2017-02-21 DIAGNOSIS — Z811 Family history of alcohol abuse and dependence: Secondary | ICD-10-CM | POA: Insufficient documentation

## 2017-02-21 DIAGNOSIS — R270 Ataxia, unspecified: Principal | ICD-10-CM | POA: Insufficient documentation

## 2017-02-21 DIAGNOSIS — R531 Weakness: Secondary | ICD-10-CM | POA: Diagnosis not present

## 2017-02-21 DIAGNOSIS — Z885 Allergy status to narcotic agent status: Secondary | ICD-10-CM | POA: Diagnosis not present

## 2017-02-21 DIAGNOSIS — N390 Urinary tract infection, site not specified: Secondary | ICD-10-CM | POA: Insufficient documentation

## 2017-02-21 DIAGNOSIS — Z681 Body mass index (BMI) 19 or less, adult: Secondary | ICD-10-CM | POA: Diagnosis not present

## 2017-02-21 DIAGNOSIS — I6782 Cerebral ischemia: Secondary | ICD-10-CM | POA: Diagnosis not present

## 2017-02-21 DIAGNOSIS — F419 Anxiety disorder, unspecified: Secondary | ICD-10-CM | POA: Diagnosis not present

## 2017-02-21 DIAGNOSIS — E43 Unspecified severe protein-calorie malnutrition: Secondary | ICD-10-CM | POA: Insufficient documentation

## 2017-02-21 DIAGNOSIS — N3000 Acute cystitis without hematuria: Secondary | ICD-10-CM | POA: Diagnosis not present

## 2017-02-21 DIAGNOSIS — R8271 Bacteriuria: Secondary | ICD-10-CM | POA: Insufficient documentation

## 2017-02-21 DIAGNOSIS — G3189 Other specified degenerative diseases of nervous system: Secondary | ICD-10-CM | POA: Insufficient documentation

## 2017-02-21 DIAGNOSIS — Z85828 Personal history of other malignant neoplasm of skin: Secondary | ICD-10-CM | POA: Diagnosis not present

## 2017-02-21 DIAGNOSIS — N951 Menopausal and female climacteric states: Secondary | ICD-10-CM | POA: Diagnosis not present

## 2017-02-21 DIAGNOSIS — Z79899 Other long term (current) drug therapy: Secondary | ICD-10-CM | POA: Insufficient documentation

## 2017-02-21 DIAGNOSIS — F329 Major depressive disorder, single episode, unspecified: Secondary | ICD-10-CM | POA: Insufficient documentation

## 2017-02-21 DIAGNOSIS — I447 Left bundle-branch block, unspecified: Secondary | ICD-10-CM | POA: Insufficient documentation

## 2017-02-21 DIAGNOSIS — R269 Unspecified abnormalities of gait and mobility: Secondary | ICD-10-CM | POA: Diagnosis not present

## 2017-02-21 DIAGNOSIS — R51 Headache: Secondary | ICD-10-CM | POA: Diagnosis not present

## 2017-02-21 LAB — CBC WITH DIFFERENTIAL/PLATELET
BASOS PCT: 0 %
Basophils Absolute: 0 10*3/uL (ref 0.0–0.1)
EOS PCT: 2 %
Eosinophils Absolute: 0.2 10*3/uL (ref 0.0–0.7)
HCT: 39.8 % (ref 36.0–46.0)
Hemoglobin: 13.4 g/dL (ref 12.0–15.0)
Lymphocytes Relative: 49 %
Lymphs Abs: 4.4 10*3/uL — ABNORMAL HIGH (ref 0.7–4.0)
MCH: 30.2 pg (ref 26.0–34.0)
MCHC: 33.7 g/dL (ref 30.0–36.0)
MCV: 89.6 fL (ref 78.0–100.0)
MONO ABS: 0.7 10*3/uL (ref 0.1–1.0)
MONOS PCT: 8 %
NEUTROS ABS: 3.7 10*3/uL (ref 1.7–7.7)
Neutrophils Relative %: 41 %
PLATELETS: 168 10*3/uL (ref 150–400)
RBC: 4.44 MIL/uL (ref 3.87–5.11)
RDW: 14.7 % (ref 11.5–15.5)
WBC: 9 10*3/uL (ref 4.0–10.5)

## 2017-02-21 LAB — I-STAT CHEM 8, ED
BUN: 23 mg/dL — AB (ref 6–20)
CALCIUM ION: 1.11 mmol/L — AB (ref 1.15–1.40)
CHLORIDE: 107 mmol/L (ref 101–111)
Creatinine, Ser: 0.6 mg/dL (ref 0.44–1.00)
GLUCOSE: 98 mg/dL (ref 65–99)
HCT: 41 % (ref 36.0–46.0)
Hemoglobin: 13.9 g/dL (ref 12.0–15.0)
Potassium: 3.5 mmol/L (ref 3.5–5.1)
Sodium: 141 mmol/L (ref 135–145)
TCO2: 24 mmol/L (ref 0–100)

## 2017-02-21 LAB — I-STAT TROPONIN, ED: Troponin i, poc: 0 ng/mL (ref 0.00–0.08)

## 2017-02-21 LAB — CBG MONITORING, ED: Glucose-Capillary: 96 mg/dL (ref 65–99)

## 2017-02-21 NOTE — ED Notes (Signed)
Dr ward called to come to assess patient in triage

## 2017-02-21 NOTE — ED Triage Notes (Signed)
Pt reports sudden onset of frontal headache and bilateral leg weakness onset tonight at 6pm. Her husband states the patient is normally full of energy but tonight she is unable to stand due to the weakness in her legs. Denies any numbness. Pt speaking in clear complete sentences. Hx of skin cancer. No unilateral weakness, no facial drooping, no arm/leg drift, strong equal hand grasps.

## 2017-02-21 NOTE — ED Provider Notes (Signed)
TIME SEEN: 11:33 PM  CHIEF COMPLAINT: Headache, unable to ambulate  HPI: Patient is a 76 year old female with history of skin cancer (husband thinks was melanoma) who presents to the emergency department with complaints of inability to walk that started just prior to arrival. States that she was feeling normal at 3 PM when she lay down for a nap. She states she will 4 PM and could not walk. She is unable to comment if this is because her legs feel weak or if it's because she feels dizzy when she is walking. States at 5:30 she had a mild left-sided headache. Has never had similar headaches before. Rates it as a 5/10. No history of stroke. Patient's husband's concern if she could have metastasis to the brain because of her history of skin cancer. Not on anticoagulation or antiplatelets. No history of stroke. No focal numbness.  Patient denies any neck or back pain.  ROS: See HPI Constitutional: no fever  Eyes: no drainage  ENT: no runny nose   Cardiovascular:  no chest pain  Resp: no SOB  GI: no vomiting GU: no dysuria Integumentary: no rash  Allergy: no hives  Musculoskeletal: no leg swelling  Neurological: no slurred speech ROS otherwise negative  PAST MEDICAL HISTORY/PAST SURGICAL HISTORY:  Past Medical History:  Diagnosis Date  . Skin cancer     MEDICATIONS:  Prior to Admission medications   Medication Sig Start Date End Date Taking? Authorizing Provider  PARoxetine (PAXIL) 20 MG tablet Take 1 tablet (20 mg total) by mouth daily. 09/13/16   Golden Circle, FNP  zolpidem (AMBIEN CR) 6.25 MG CR tablet Take 1 tablet (6.25 mg total) by mouth at bedtime as needed for sleep. 09/17/16   Golden Circle, FNP    ALLERGIES:  Allergies  Allergen Reactions  . Codeine Nausea And Vomiting    SOCIAL HISTORY:  Social History  Substance Use Topics  . Smoking status: Never Smoker  . Smokeless tobacco: Never Used  . Alcohol use No    FAMILY HISTORY: Family History  Problem  Relation Age of Onset  . Tuberculosis Mother   . Alcohol abuse Father     EXAM: BP 123/78 (BP Location: Left Arm)   Pulse (!) 59   Temp 97.8 F (36.6 C) (Oral)   Resp 15   SpO2 94%  CONSTITUTIONAL: Alert and oriented and responds appropriately to questions. Elderly, afebrile, no distress HEAD: Normocephalic EYES: Conjunctivae clear, pupils appear equal, EOMI ENT: normal nose; moist mucous membranes NECK: Supple, no meningismus, no nuchal rigidity, no LAD  CARD: RRR; S1 and S2 appreciated; no murmurs, no clicks, no rubs, no gallops RESP: Normal chest excursion without splinting or tachypnea; breath sounds clear and equal bilaterally; no wheezes, no rhonchi, no rales, no hypoxia or respiratory distress, speaking full sentences ABD/GI: Normal bowel sounds; non-distended; soft, non-tender, no rebound, no guarding, no peritoneal signs, no hepatosplenomegaly BACK:  The back appears normal and is non-tender to palpation, there is no CVA tenderness EXT: Normal ROM in all joints; non-tender to palpation; no edema; normal capillary refill; no cyanosis, no calf tenderness or swelling    SKIN: Normal color for age and race; warm; no rash NEURO: Moves all extremities equally, patient reports slightly decreased sensation over the left forehead otherwise normal sensation diffusely, cranial nerves II through XII intact, normal speech, patient has normal strength in all 4 extremities but when we get her up to stand she seems very unsteady and needs significant assistance. PSYCH: The patient's  mood and manner are appropriate. Grooming and personal hygiene are appropriate.  MEDICAL DECISION MAKING: Patient here with weakness in both legs, difficult to ambulate and left-sided headache. Last seen normal 3 PM. We'll obtain CT of her head, labs, urine. At this time she has no focal neurologic deficits and I do not feel a code stroke needs to be initiated. She has normal strength in all 4 extremities but seems to  have difficulty with walking without assistance but is able to walk with holding my hands and does so slowly.  ED PROGRESS: Patient's labs unremarkable. Head CT shows no acute abnormality. We'll proceed with MRI of the brain with and without contrast for further evaluation.   Patient appears to have a mild urinary tract infection. We'll send urine culture and give ceftriaxone.  6:35 AM  D/w Dr. Jeannine Boga with neuro radiology who has reviewed patient's MRI although incomplete. He does not see any stroke or mass. Will admit to medicine for inability to walk and urinary tract infection. Patient has been comfortable with this plan. Radiology does not feel this time the patient has to come back for further imaging at last medicine team wants to look for very small ischemic abnormality. PCP is with [facility].  6:48 AM Discussed patient's case with hospitalist, Dr. Loleta Books.  I have recommended admission and patient (and family if present) agree with this plan. Admitting physician will place admission orders.   I reviewed all nursing notes, vitals, pertinent previous records, EKGs, lab and urine results, imaging (as available).     Date: 02/22/2017 2:31 AM  Rate: 54  Rhythm: normal sinus rhythm  QRS Axis: normal  Intervals: Left bundle branch block  ST/T Wave abnormalities: normal  Conduction Disutrbances: none  Narrative Interpretation: Left bundle branch block which appears new compared to previous EKG        Dannon Nguyenthi, Delice Bison, DO 02/22/17 [phone]

## 2017-02-21 NOTE — ED Notes (Signed)
No code stroke at this time but Dr Leonides Schanz wants patient to be taken to CT for a STAT head CYT. This RN called CT and pt to be taken to CT 2 at this time.

## 2017-02-21 NOTE — ED Notes (Signed)
Checked [facility] 96 RN Melanie informed

## 2017-02-21 NOTE — ED Notes (Signed)
Dr. Leonides Schanz in triage to assess patient

## 2017-02-21 NOTE — ED Notes (Signed)
Pt at CT at this time.

## 2017-02-22 ENCOUNTER — Observation Stay (HOSPITAL_COMMUNITY)
Admission: EM | Admit: 2017-02-22 | Discharge: 2017-02-23 | Disposition: A | Discharge status: Home / Self Care | Payer: Medicare HMO | Attending: Internal Medicine | Admitting: Internal Medicine

## 2017-02-22 ENCOUNTER — Encounter (HOSPITAL_COMMUNITY): Payer: Self-pay | Admitting: Internal Medicine

## 2017-02-22 ENCOUNTER — Emergency Department (HOSPITAL_COMMUNITY): Payer: Medicare HMO

## 2017-02-22 ENCOUNTER — Other Ambulatory Visit: Payer: Self-pay

## 2017-02-22 DIAGNOSIS — R531 Weakness: Secondary | ICD-10-CM | POA: Diagnosis not present

## 2017-02-22 DIAGNOSIS — R5383 Other fatigue: Secondary | ICD-10-CM

## 2017-02-22 DIAGNOSIS — R634 Abnormal weight loss: Secondary | ICD-10-CM

## 2017-02-22 DIAGNOSIS — N3000 Acute cystitis without hematuria: Secondary | ICD-10-CM

## 2017-02-22 DIAGNOSIS — F419 Anxiety disorder, unspecified: Secondary | ICD-10-CM

## 2017-02-22 DIAGNOSIS — E43 Unspecified severe protein-calorie malnutrition: Secondary | ICD-10-CM | POA: Insufficient documentation

## 2017-02-22 DIAGNOSIS — K5909 Other constipation: Secondary | ICD-10-CM | POA: Diagnosis not present

## 2017-02-22 DIAGNOSIS — R270 Ataxia, unspecified: Secondary | ICD-10-CM

## 2017-02-22 DIAGNOSIS — R269 Unspecified abnormalities of gait and mobility: Secondary | ICD-10-CM

## 2017-02-22 DIAGNOSIS — F329 Major depressive disorder, single episode, unspecified: Secondary | ICD-10-CM | POA: Diagnosis present

## 2017-02-22 DIAGNOSIS — K5900 Constipation, unspecified: Secondary | ICD-10-CM | POA: Insufficient documentation

## 2017-02-22 DIAGNOSIS — I454 Nonspecific intraventricular block: Secondary | ICD-10-CM | POA: Diagnosis present

## 2017-02-22 DIAGNOSIS — N390 Urinary tract infection, site not specified: Secondary | ICD-10-CM | POA: Diagnosis present

## 2017-02-22 DIAGNOSIS — F32A Depression, unspecified: Secondary | ICD-10-CM | POA: Diagnosis present

## 2017-02-22 HISTORY — DX: Abnormal weight loss: R63.4

## 2017-02-22 HISTORY — DX: Constipation, unspecified: K59.00

## 2017-02-22 HISTORY — DX: Urinary tract infection, site not specified: N39.0

## 2017-02-22 HISTORY — DX: Anxiety disorder, unspecified: F41.9

## 2017-02-22 LAB — COMPREHENSIVE METABOLIC PANEL
ALBUMIN: 4.3 g/dL (ref 3.5–5.0)
ALT: 13 U/L — ABNORMAL LOW (ref 14–54)
ANION GAP: 11 (ref 5–15)
AST: 27 U/L (ref 15–41)
Alkaline Phosphatase: 75 U/L (ref 38–126)
BUN: 20 mg/dL (ref 6–20)
CHLORIDE: 107 mmol/L (ref 101–111)
CO2: 22 mmol/L (ref 22–32)
Calcium: 9.6 mg/dL (ref 8.9–10.3)
Creatinine, Ser: 0.72 mg/dL (ref 0.44–1.00)
GFR calc non Af Amer: >60 mL/min (ref >60)
GLUCOSE: 103 mg/dL — AB (ref 65–99)
POTASSIUM: 3.6 mmol/L (ref 3.5–5.1)
SODIUM: 140 mmol/L (ref 135–145)
Total Bilirubin: 0.7 mg/dL (ref 0.3–1.2)
Total Protein: 7 g/dL (ref 6.5–8.1)

## 2017-02-22 LAB — RAPID URINE DRUG SCREEN, HOSP PERFORMED
Amphetamines: NOT DETECTED
BARBITURATES: NOT DETECTED
BENZODIAZEPINES: NOT DETECTED
COCAINE: NOT DETECTED
Opiates: NOT DETECTED
Tetrahydrocannabinol: NOT DETECTED

## 2017-02-22 LAB — MAGNESIUM: MAGNESIUM: 1.9 mg/dL (ref 1.7–2.4)

## 2017-02-22 LAB — PROTIME-INR
INR: 0.94
PROTHROMBIN TIME: 12.6 s (ref 11.4–15.2)

## 2017-02-22 LAB — ETHANOL: Alcohol, Ethyl (B): <5 mg/dL (ref <5)

## 2017-02-22 LAB — URINALYSIS, ROUTINE W REFLEX MICROSCOPIC
Bilirubin Urine: NEGATIVE
GLUCOSE, UA: NEGATIVE mg/dL
KETONES UR: NEGATIVE mg/dL
Nitrite: NEGATIVE
PROTEIN: NEGATIVE mg/dL
Specific Gravity, Urine: 1.006 (ref 1.005–1.030)
pH: 6 (ref 5.0–8.0)

## 2017-02-22 LAB — APTT: APTT: 27 s (ref 24–36)

## 2017-02-22 LAB — TSH: TSH: 2.631 u[IU]/mL (ref 0.350–4.500)

## 2017-02-22 LAB — PHOSPHORUS: PHOSPHORUS: 4 mg/dL (ref 2.5–4.6)

## 2017-02-22 LAB — TROPONIN I

## 2017-02-22 LAB — VITAMIN B12: VITAMIN B 12: 615 pg/mL (ref 180–914)

## 2017-02-22 MED ORDER — ENSURE ENLIVE PO LIQD
237.0000 mL | Freq: Two times a day (BID) | ORAL | Status: DC
  Administered 2017-02-22: 237 mL via ORAL

## 2017-02-22 MED ORDER — SODIUM CHLORIDE 0.9% FLUSH
3.0000 mL | Freq: Two times a day (BID) | INTRAVENOUS | Status: DC
  Administered 2017-02-22 – 2017-02-23 (×3): 3 mL via INTRAVENOUS

## 2017-02-22 MED ORDER — LORAZEPAM 2 MG/ML IJ SOLN
0.5000 mg | Freq: Once | INTRAMUSCULAR | Status: AC
  Administered 2017-02-22: 0.5 mg via INTRAVENOUS
  Filled 2017-02-22: qty 1

## 2017-02-22 MED ORDER — ZOLPIDEM TARTRATE 5 MG PO TABS: 5.0000 mg | ORAL_TABLET | Freq: Every evening | ORAL | Status: DC | PRN

## 2017-02-22 MED ORDER — SENNOSIDES-DOCUSATE SODIUM 8.6-50 MG PO TABS: 1.0000 | ORAL_TABLET | Freq: Every evening | ORAL | Status: DC | PRN

## 2017-02-22 MED ORDER — ACETAMINOPHEN 325 MG PO TABS: 650.0000 mg | ORAL_TABLET | Freq: Four times a day (QID) | ORAL | Status: DC | PRN

## 2017-02-22 MED ORDER — ADULT MULTIVITAMIN W/MINERALS CH
1.0000 | ORAL_TABLET | Freq: Every day | ORAL | Status: DC
  Administered 2017-02-22 – 2017-02-23 (×2): 1 via ORAL
  Filled 2017-02-22 (×2): qty 1

## 2017-02-22 MED ORDER — PRO-STAT SUGAR FREE PO LIQD
30.0000 mL | Freq: Two times a day (BID) | ORAL | Status: DC
  Administered 2017-02-22 – 2017-02-23 (×2): 30 mL via ORAL
  Filled 2017-02-22 (×2): qty 30

## 2017-02-22 MED ORDER — DEXTROSE 5 % IV SOLN
1.0000 g | INTRAVENOUS | Status: DC
  Administered 2017-02-23: 1 g via INTRAVENOUS
  Filled 2017-02-22: qty 10

## 2017-02-22 MED ORDER — ACETAMINOPHEN 650 MG RE SUPP: 650.0000 mg | Freq: Four times a day (QID) | RECTAL | Status: DC | PRN

## 2017-02-22 MED ORDER — ONDANSETRON HCL 4 MG/2ML IJ SOLN: 4.0000 mg | Freq: Four times a day (QID) | INTRAMUSCULAR | Status: DC | PRN

## 2017-02-22 MED ORDER — ENOXAPARIN SODIUM 40 MG/0.4ML ~~LOC~~ SOLN
40.0000 mg | SUBCUTANEOUS | Status: DC
  Administered 2017-02-22: 40 mg via SUBCUTANEOUS
  Filled 2017-02-22: qty 0.4

## 2017-02-22 MED ORDER — CEFTRIAXONE SODIUM 1 G IJ SOLR
1.0000 g | Freq: Once | INTRAMUSCULAR | Status: AC
  Administered 2017-02-22: 1 g via INTRAVENOUS
  Filled 2017-02-22: qty 10

## 2017-02-22 MED ORDER — ONDANSETRON HCL 4 MG PO TABS: 4.0000 mg | ORAL_TABLET | Freq: Four times a day (QID) | ORAL | Status: DC | PRN

## 2017-02-22 MED ORDER — POLYETHYL GLYCOL-PROPYL GLYCOL 0.4-0.3 % OP GEL: Freq: Every day | OPHTHALMIC | Status: DC | PRN

## 2017-02-22 MED ORDER — IBUPROFEN 400 MG PO TABS: 800.0000 mg | ORAL_TABLET | Freq: Four times a day (QID) | ORAL | Status: DC | PRN

## 2017-02-22 NOTE — ED Notes (Signed)
Attempted to call report x 1 to [facility] [place]. 

## 2017-02-22 NOTE — ED Notes (Signed)
MRI called, pt not lying still during exam. Went to MRI to administer another 0.5 ativan.

## 2017-02-22 NOTE — Progress Notes (Signed)
Initial Nutrition Assessment  DOCUMENTATION CODES:   Severe malnutrition in context of chronic illness, Underweight  INTERVENTION:   -Ensure Enlive po BID, each supplement provides 350 kcal and 20 grams of protein -30 ml Prostat BID, each supplement provides 100 kcals and 15 grams protein -MVI daily -Recommend monitor K, Phos, and Mg daily x 3 days as pt starts to take PO's, as pt is at high risk for refeeding syndrome  NUTRITION DIAGNOSIS:   Malnutrition (Severe) related to chronic illness (skin cancer) as evidenced by severe depletion of body fat, severe depletion of muscle mass.  GOAL:   Patient will meet greater than or equal to 90% of their needs  MONITOR:   PO intake, Supplement acceptance, Labs, Weight trends, Skin, I & O's  REASON FOR ASSESSMENT:   Consult Assessment of nutrition requirement/status  ASSESSMENT:   Gwendolyn Bright is a 76 y.o. female with medical history significant for skin cancer, anxiety/depression, unintentional weight loss/decreased oral intake presents to the emergency department with the chief complaint of sudden inability to walk/bear weight and generalized weakness. Initial evaluation includes a CT of the head without abnormality and a urinary tract infection.  Pt admitted with ataxia and generalized weakness.   Pt very somnolent at time of visit. Pt did not awaken when greeted by RD or during exam.   Suspect poor oral intake PTA. Noted pt consumed about half of a carton of whole milk on tray table.   Reviewed wt hx; noted pt has experienced a 14.2% wt loss over the past year. While this is not significant, weight loss is concerning given pt's malnutrition and underweight status.   Nutrition-Focused physical exam completed. Findings are severe fat depletion, severe muscle depletion, and no edema.   Labs reviewed.   Diet Order:  Diet regular Room service appropriate? Yes; Fluid consistency: Thin  Skin:  Reviewed, no issues  Last BM:   02/21/17  Height:   Ht Readings from Last 1 Encounters:  02/22/17 5\' 5"  (1.651 m)    Weight:   Wt Readings from Last 1 Encounters:  02/22/17 90 lb 8 oz (41.1 kg)    Ideal Body Weight:  56.8 kg  BMI:  Body mass index is 15.06 kg/m.  Estimated Nutritional Needs:   Kcal:  1250-1450  Protein:  60-75 grams  Fluid:  >1.2 L  EDUCATION NEEDS:   Education needs addressed  Christabelle Hanzlik A. Jimmye Norman, RD, LDN, CDE Pager: [phone] After hours Pager: [phone]

## 2017-02-22 NOTE — ED Notes (Signed)
Pt transported to MRI 

## 2017-02-22 NOTE — ED Notes (Signed)
Pt back from MRI, called MRI b/c scan still reads "in process" Tripp from MRI states scan was never completed. Encouraged him to let ED staff know when pt is not able to complete scan.Marland KitchenMarland Kitchen

## 2017-02-22 NOTE — H&P (Signed)
History and Physical    Gwendolyn Bright AOZ:[id number] DOB: [birth year] DOA: 02/22/2017  PCP: Golden Circle, FNP Patient coming from: home  Chief Complaint: ataxia/uti  HPI: Gwendolyn Bright is a 76 y.o. female with medical history significant for skin cancer, anxiety/depression, unintentional weight loss/decreased oral intake presents to the emergency department with the chief complaint of sudden inability to walk/bear weight and generalized weakness. Initial evaluation includes a CT of the head without abnormality and a urinary tract infection.  Information is obtained from the husband is at the bedside as information from patient may be unreliable due to mild dementia. He reports she was in her usual state of health which is quite active until yesterday afternoon when she awakened from a nap and was unable to walk. There are no complaints of pain numbness tingling. She complain of a mild frontal headache. Husband reports she did not complain of lower extremity pain, back pain dizziness lightheadedness weekness. No complaints of chest pain palpitations shortness of breath. No recent nausea vomiting diarrhea constipation melena bright red blood per rectum. No recent travel or sick contacts. No recent falls. Husband reports a history of skin cancer and is concerned about metastasis.   ED Course: Emergency department she's afebrile hemodynamically stable and not hypoxic. She is restless and anxious and is provided with Ativan intravenously.   Review of Systems: As per HPI otherwise all other systems reviewed and are negative.   Ambulatory Status: She ambulates independently at home. No recent falls. Is independent with ADLs  Past Medical History:  Diagnosis Date  . Anxiety   . Constipation   . Skin cancer   . Weight loss     Past Surgical History:  Procedure Laterality Date  . APPENDECTOMY    . CATARACT EXTRACTION    . skin cancer remo      Social History   Social History  .  Marital status: Married    Spouse name: N/A  . Number of children: 3  . Years of education: 48   Occupational History  . Associate     Social History Main Topics  . Smoking status: Never Smoker  . Smokeless tobacco: Never Used  . Alcohol use No  . Drug use: No  . Sexual activity: Not on file   Other Topics Concern  . Not on file   Social History Narrative   Denies abuse and feels safe at home.     Allergies  Allergen Reactions  . Codeine Nausea And Vomiting    Family History  Problem Relation Age of Onset  . Tuberculosis Mother   . Alcohol abuse Father     Prior to Admission medications   Medication Sig Start Date End Date Taking? Authorizing Provider  estrogens, conjugated, (PREMARIN) 0.3 MG tablet Take 0.3 mg by mouth daily as needed (hot flashes). Take daily for 21 days then do not take for 7 days.   Yes [provider]  ibuprofen (ADVIL,MOTRIN) 200 MG tablet Take 800 mg by mouth every 6 (six) hours as needed for headache.   Yes [provider]  Polyethyl Glycol-Propyl Glycol (SYSTANE OP) Apply 1-2 drops to eye daily as needed (dryness).   Yes [provider]  zolpidem (AMBIEN CR) 6.25 MG CR tablet Take 1 tablet (6.25 mg total) by mouth at bedtime as needed for sleep. 09/17/16  Yes Golden Circle, FNP    Physical Exam: Vitals:   02/22/17 0615 02/22/17 0700 02/22/17 0721 02/22/17 0730  BP: 137/84 106/62  106/62 (!) 100/56  Pulse: 64 61 (!) 55 61  Resp: 15 11 10 14   Temp:      TempSrc:      SpO2: 99% 94% 98% 98%     General:  Thin and frail appearing quite drowsy but arouses to verbal stimuli Eyes:  PERRL, EOMI, normal lids, iris ENT:  grossly normal hearing, lips & tongue, mucous membranes of her mouth are pink slightly dry Neck:  no LAD, masses or thyromegaly Cardiovascular:  RRR, no m/r/g. No LE edema. Pedal pulses present and palpable Respiratory:  CTA bilaterally, no w/r/r. Normal respiratory effort. Abdomen:  soft, ntnd,  positive bowel sounds throughout no guarding or rebounding Skin:  no rash or induration seen on limited exam Musculoskeletal:  grossly normal tone BUE/BLE, good ROM, no bony abnormality Psychiatric:  grossly normal mood and affect, speech fluent and appropriate, AOx3 Neurologic:  Drowsy but arouses to verbal stimuli. Oriented to self only. Answers questions and attempts to follow commands. Moves all extremities spontaneously. Bilateral grip 5 out of 5 lower extremity strength 4 out of 5 bilaterally. Tongue midline  Labs on Admission: I have personally reviewed following labs and imaging studies  CBC:  Recent Labs Lab 02/21/17 2337 02/21/17 2350  WBC 9.0  --   NEUTROABS 3.7  --   HGB 13.4 13.9  HCT 39.8 41.0  MCV 89.6  --   PLT 168  --    Basic Metabolic Panel:  Recent Labs Lab 02/21/17 2337 02/21/17 2350  NA 140 141  K 3.6 3.5  CL 107 107  CO2 22  --   GLUCOSE 103* 98  BUN 20 23*  CREATININE 0.72 0.60  CALCIUM 9.6  --    GFR: CrCl cannot be calculated (Unknown ideal weight.). Liver Function Tests:  Recent Labs Lab 02/21/17 2337  AST 27  ALT 13*  ALKPHOS 75  BILITOT 0.7  PROT 7.0  ALBUMIN 4.3   No results for input(s): LIPASE, AMYLASE in the last 168 hours. No results for input(s): AMMONIA in the last 168 hours. Coagulation Profile:  Recent Labs Lab 02/21/17 2337  INR 0.94   Cardiac Enzymes: No results for input(s): CKTOTAL, CKMB, CKMBINDEX, TROPONINI in the last 168 hours. BNP (last 3 results) No results for input(s): PROBNP in the last 8760 hours. HbA1C: No results for input(s): HGBA1C in the last 72 hours. CBG:  Recent Labs Lab 02/21/17 2330  GLUCAP 96   Lipid Profile: No results for input(s): CHOL, HDL, LDLCALC, TRIG, CHOLHDL, LDLDIRECT in the last 72 hours. Thyroid Function Tests: No results for input(s): TSH, T4TOTAL, FREET4, T3FREE, THYROIDAB in the last 72 hours. Anemia Panel: No results for input(s): VITAMINB12, FOLATE, FERRITIN,  TIBC, IRON, RETICCTPCT in the last 72 hours. Urine analysis:    Component Value Date/Time   COLORURINE STRAW (A) 02/22/2017 0252   APPEARANCEUR CLEAR 02/22/2017 0252   LABSPEC 1.006 02/22/2017 0252   PHURINE 6.0 02/22/2017 0252   GLUCOSEU NEGATIVE 02/22/2017 0252   HGBUR SMALL (A) 02/22/2017 0252   BILIRUBINUR NEGATIVE 02/22/2017 0252   KETONESUR NEGATIVE 02/22/2017 0252   PROTEINUR NEGATIVE 02/22/2017 0252   UROBILINOGEN 0.2 01/09/2015 1301   NITRITE NEGATIVE 02/22/2017 0252   LEUKOCYTESUR SMALL (A) 02/22/2017 0252    Creatinine Clearance: CrCl cannot be calculated (Unknown ideal weight.).  Sepsis Labs: @LABRCNTIP (procalcitonin:4,lacticidven:4) )No results found for this or any previous visit (from the past 240 hour(s)).   Radiological Exams on Admission: Ct Head Wo Contrast  Result Date: 02/21/2017 CLINICAL DATA:  Sudden onset of frontal headache and bilateral leg weakness tonight at 6 p.m. Stroke symptoms greater than 8 hours. EXAM: CT HEAD WITHOUT CONTRAST TECHNIQUE: Contiguous axial images were obtained from the base of the skull through the vertex without intravenous contrast. COMPARISON:  03/25/2016 FINDINGS: Brain: Mild diffuse cerebral atrophy. Low-attenuation changes in the deep white matter consistent small vessel ischemia. No evidence of acute infarction, hemorrhage, hydrocephalus, extra-axial collection or mass lesion/mass effect. Vascular: No hyperdense vessel or unexpected calcification. Skull: Normal. Negative for fracture or focal lesion. Sinuses/Orbits: No acute finding. Other: No significant changes since previous study. IMPRESSION: No acute intracranial abnormalities. Chronic atrophy and small vessel ischemic changes. Electronically Signed   By: Lucienne Capers M.D.   On: 02/21/2017 23:49   Mr Brain Wo Contrast  Result Date: 02/22/2017 CLINICAL DATA:  Initial evaluation for acute generalized weakness. EXAM: MRI HEAD WITHOUT CONTRAST TECHNIQUE: Multiplanar,  multiecho pulse sequences of the brain and surrounding structures were obtained without intravenous contrast. COMPARISON:  Prior CT from 02/21/2017. FINDINGS: Brain: Examination is limited as the patient was unable to tolerate the full length of the exam. As a resolved, only axial diffusion and T2 weighted sequences were performed. Diffuse prominence of the CSF containing spaces is compatible with generalized cerebral atrophy. Patchy T2 signal abnormality within the periventricular and deep white matter both cerebral hemispheres most consistent with chronic small vessel ischemic disease. Chronic microvascular ischemic changes present within the pons as well. No diffusion abnormality is seen to suggest acute ischemic infarct. Gray-white matter differentiation maintained. No areas of encephalomalacia to suggest chronic infarction identified on this limited exam. No mass lesion, midline shift or mass effect. Diffuse ventricular prominence related to global parenchymal volume loss of hydrocephalus. No extra-axial fluid collection. Major dural sinuses are grossly patent. Vascular: Major intracranial vascular flow voids are maintained. Skull and upper cervical spine: Craniocervical junction grossly unremarkable, not well evaluated on this limited exam. Visualized bone marrow signal intensity within normal limits. No scalp soft tissue abnormality. Sinuses/Orbits: Globes oral soft tissues within normal limits. Patient is status post lens extraction bilaterally. Paranasal sinuses are clear. No mastoid effusion. Inner ear structures grossly unremarkable. Other: No other significant finding. IMPRESSION: 1. Limited study due to the patient's inability to tolerate the full length of the exam. 2. No acute intracranial infarct or definite other acute intracranial process. 3. Moderate cerebral atrophy with chronic small vessel ischemic disease. Findings were discussed with Dr. Leonides Schanz at approximately 6:45 a.m. on 02/22/2017.  Electronically Signed   By: Jeannine Boga M.D.   On: 02/22/2017 07:21    EKG: Independently reviewed. Sinus rhythm Left bundle branch block Assessment/Plan Principal Problem:   Ataxia Active Problems:   Anxiety and depression   UTI (urinary tract infection)   Generalized weakness   Weight loss   BBB (bundle branch block)   #1. Ataxia/generalized weakness. Etiology unclear. CT of the head without acute abnormality. MRI of the brain limited but reveals no acute intracranial infarct or definite intracranial process. Patient does have urinary tract infection but no metabolic derangements, initial troponin negative. Interestingly ED physician notes patient unable to walk unattended but if you hold her hand she can ambulate with an unsteady gait. Chart review also indicates she was evaluated in December 2017 PCP noted unintentional weight loss and depression to the point she had difficulty walking. In the emergency department she's afebrile and hemodynamically stable. Reportedly Dr. Jeannine Boga with neuroradiology reviewed the MRI and opines no further imaging needed. -Admit to telemetry -Follow urine  culture -Continue Rocephin -Obtain 1 more troponin -Obtain a TSH -Check B-12 folate -Physical therapy -Minimize sedating medications  #2. Urinary tract infection. Urinalysis consistent with UTI. No reports of any dysuria frequency urgency. She was provided with Rocephin in the emergency department. Urine culture was also obtained -Follow urine culture -Continue Rocephin -Monitor urine output  #3. Bundle branch block. EKG as noted above. Heart rate range is 51-64. No chest pain reported. Initial troponin negative. No history of same. -Obtain 1 more troponin -Monitor on telemetry -Outpatient follow-up versus inpatient consult  #4. Weight loss. Current weight 90 pounds. Chart review indicates this is an ongoing problem. Husband reports he only eats about 25% of her meals. Husband reports  she's been anxious and depressed since her son's death 5 years ago. Chart review indicates symptoms so severe at times she has difficulty walking.  -Regular diet -Nutritional consult -Boost -Consider behavioral health consult  5. Anxiety depression. May be related to loss of son 5 years ago. Home medications at one time included paroxetine but she could not tolerate.      DVT prophylaxis: lovenox  Code Status: full  Family Communication: husband at bedside  Disposition Plan: home hopefully tomorrow  Consults called: none  Admission status: obs   Dyanne Carrel M MD [facility]  If 7PM-7AM, please contact night-coverage [email or web address] Password [facility]  02/22/2017, 7:49 AM

## 2017-02-23 DIAGNOSIS — R270 Ataxia, unspecified: Secondary | ICD-10-CM | POA: Diagnosis not present

## 2017-02-23 DIAGNOSIS — E43 Unspecified severe protein-calorie malnutrition: Secondary | ICD-10-CM | POA: Insufficient documentation

## 2017-02-23 LAB — URINE CULTURE: CULTURE: NO GROWTH

## 2017-02-23 LAB — BASIC METABOLIC PANEL
Anion gap: 7 (ref 5–15)
BUN: 22 mg/dL — ABNORMAL HIGH (ref 6–20)
CALCIUM: 9.3 mg/dL (ref 8.9–10.3)
CO2: 27 mmol/L (ref 22–32)
Chloride: 105 mmol/L (ref 101–111)
Creatinine, Ser: 0.62 mg/dL (ref 0.44–1.00)
GFR calc non Af Amer: >60 mL/min (ref >60)
GLUCOSE: 109 mg/dL — AB (ref 65–99)
POTASSIUM: 3.7 mmol/L (ref 3.5–5.1)
SODIUM: 139 mmol/L (ref 135–145)

## 2017-02-23 LAB — CBC WITH DIFFERENTIAL/PLATELET
BASOS ABS: 0 10*3/uL (ref 0.0–0.1)
BASOS PCT: 0 %
Eosinophils Absolute: 0.2 10*3/uL (ref 0.0–0.7)
Eosinophils Relative: 2 %
HEMATOCRIT: 40.6 % (ref 36.0–46.0)
Hemoglobin: 13.3 g/dL (ref 12.0–15.0)
LYMPHS PCT: 42 %
Lymphs Abs: 2.9 10*3/uL (ref 0.7–4.0)
MCH: 29.7 pg (ref 26.0–34.0)
MCHC: 32.8 g/dL (ref 30.0–36.0)
MCV: 90.6 fL (ref 78.0–100.0)
MONO ABS: 0.6 10*3/uL (ref 0.1–1.0)
Monocytes Relative: 9 %
NEUTROS ABS: 3.2 10*3/uL (ref 1.7–7.7)
NEUTROS PCT: 47 %
Platelets: 172 10*3/uL (ref 150–400)
RBC: 4.48 MIL/uL (ref 3.87–5.11)
RDW: 15 % (ref 11.5–15.5)
WBC: 6.8 10*3/uL (ref 4.0–10.5)

## 2017-02-23 LAB — PREALBUMIN: Prealbumin: 24.4 mg/dL (ref 18–38)

## 2017-02-23 MED ORDER — ENSURE ENLIVE PO LIQD: 237.0000 mL | Freq: Two times a day (BID) | ORAL | 12 refills | Status: AC

## 2017-02-23 MED ORDER — PRO-STAT SUGAR FREE PO LIQD: 30.0000 mL | Freq: Two times a day (BID) | ORAL | 0 refills | Status: DC

## 2017-02-23 NOTE — Evaluation (Addendum)
Physical Therapy Evaluation Patient Details Name: Gwendolyn Bright MRN: [id number] DOB: Feb 19, 1941 Today's Date: 02/23/2017   History of Present Illness  Gwendolyn Bright is a 76 y.o. female with medical history significant for skin cancer, anxiety/depression, unintentional weight loss/decreased oral intake presents to the emergency department with the chief complaint of sudden inability to walk/bear weight and generalized weakness.  Both CT and limited MRI were negative for acute event.  Clinical Impression  Pt is at or close to baseline functioning and should be safe at home with husband's assist. There are no further acute PT needs.  Will sign off at this time.     Follow Up Recommendations No PT follow up    Equipment Recommendations  None recommended by PT    Recommendations for Other Services       Precautions / Restrictions Precautions Precautions: Fall Restrictions Weight Bearing Restrictions: No      Mobility  Bed Mobility Overal bed mobility: Modified Independent                Transfers Overall transfer level: Needs assistance   Transfers: Sit to/from Stand Sit to Stand: Supervision            Ambulation/Gait Ambulation/Gait assistance: Supervision Ambulation Distance (Feet): 300 Feet Assistive device: None Gait Pattern/deviations: Step-through pattern   Gait velocity interpretation: at or above normal speed for age/gender General Gait Details: steady gait, managed all DGI activities without checking weaving through obstacles. all well.  Stairs Stairs: Yes Stairs assistance: Supervision Stair Management: One rail Right;Alternating pattern;Forwards Number of Stairs: 3 General stair comments: safe with rail  Wheelchair Mobility    Modified Rankin (Stroke Patients Only)       Balance Overall balance assessment: Modified Independent                               Standardized Balance Assessment Standardized Balance Assessment :  Dynamic Gait Index   Dynamic Gait Index Level Surface: Normal Change in Gait Speed: Normal Gait with Horizontal Head Turns: Normal Gait with Vertical Head Turns: Mild Impairment Gait and Pivot Turn: Normal Step Over Obstacle: Mild Impairment Steps: Mild Impairment       Pertinent Vitals/Pain Pain Assessment: No/denies pain    Home Living Family/patient expects to be discharged to:: Private residence Living Arrangements: Spouse/significant other Available Help at Discharge: Family;Available 24 hours/day Type of Home: House Home Access: Stairs to enter Entrance Stairs-Rails: Chemical engineer of Steps: few Home Layout: One level Home Equipment: None      Prior Function Level of Independence: Independent               Hand Dominance        Extremity/Trunk Assessment   Upper Extremity Assessment Upper Extremity Assessment: Overall WFL for tasks assessed    Lower Extremity Assessment Lower Extremity Assessment: Overall WFL for tasks assessed       Communication   Communication: No difficulties  Cognition Arousal/Alertness: Awake/alert Behavior During Therapy: Impulsive (distracted) Overall Cognitive Status: History of cognitive impairments - at baseline                                        General Comments General comments (skin integrity, edema, etc.): lower score on DGI suggest low fall risk    Exercises     Assessment/Plan  PT Assessment Patent does not need any further PT services  PT Problem List         PT Treatment Interventions      PT Goals (Current goals can be found in the Care Plan section)  Acute Rehab PT Goals PT Goal Formulation: All assessment and education complete, DC therapy    Frequency     Barriers to discharge        Co-evaluation               AM-PAC PT "6 Clicks" Daily Activity  Outcome Measure Difficulty turning over in bed (including adjusting bedclothes, sheets and  blankets)?: None Difficulty moving from lying on back to sitting on the side of the bed? : None Difficulty sitting down on and standing up from a chair with arms (e.g., wheelchair, bedside commode, etc,.)?: None Help needed moving to and from a bed to chair (including a wheelchair)?: None Help needed walking in hospital room?: A Little Help needed climbing 3-5 steps with a railing? : A Little 6 Click Score: 22    End of Session   Activity Tolerance: Patient tolerated treatment well Patient left: in bed;with call bell/phone within reach;with family/visitor present Nurse Communication: Mobility status PT Visit Diagnosis: Unsteadiness on feet (R26.81)    Time: 2671-2458 PT Time Calculation (min) (ACUTE ONLY): 17 min   Charges:   PT Evaluation $PT Eval Low Complexity: 1 Procedure     PT G Codes:   PT G-Codes **NOT FOR INPATIENT CLASS** Functional Assessment Tool Used: AM-PAC 6 Clicks Basic Mobility;Clinical judgement Functional Limitation: Mobility: Walking and moving around Mobility: Walking and Moving Around Current Status (K9983): At least 1 percent but less than 20 percent impaired, limited or restricted Mobility: Walking and Moving Around Goal Status [phone]): At least 1 percent but less than 20 percent impaired, limited or restricted Mobility: Walking and Moving Around Discharge Status [phone]): At least 1 percent but less than 20 percent impaired, limited or restricted    02/23/2017  Donnella Sham, PT [phone]  (pager)  Gwendolyn Bright 02/23/2017, 12:12 PM

## 2017-02-23 NOTE — Progress Notes (Signed)
Tech arrived to room and offered Pt a bath. Pt and husband stated that they are waiting on the doctor to release Pt. And would like to pass on bath.

## 2017-02-23 NOTE — Progress Notes (Signed)
Nsg Discharge Note  Admit Date:  02/22/2017 Discharge date: 02/23/2017   Gwendolyn Bright to be D/C'd Home per MD order.  AVS completed.  Copy for chart, and copy for patient signed, and dated. Patient/caregiver able to verbalize understanding.  Discharge Medication: Allergies as of 02/23/2017      Reactions   Codeine Nausea And Vomiting      Medication List    TAKE these medications   estrogens (conjugated) 0.3 MG tablet Commonly known as:  PREMARIN Take 0.3 mg by mouth daily as needed (hot flashes). Take daily for 21 days then do not take for 7 days.   feeding supplement (ENSURE ENLIVE) Liqd Take 237 mLs by mouth 2 (two) times daily between meals.   feeding supplement (PRO-STAT SUGAR FREE 64) Liqd Take 30 mLs by mouth 2 (two) times daily.   ibuprofen 200 MG tablet Commonly known as:  ADVIL,MOTRIN Take 800 mg by mouth every 6 (six) hours as needed for headache.   SYSTANE OP Apply 1-2 drops to eye daily as needed (dryness).   zolpidem 6.25 MG CR tablet Commonly known as:  AMBIEN CR Take 1 tablet (6.25 mg total) by mouth at bedtime as needed for sleep.       Discharge Assessment: Vitals:   02/22/17 2225 02/23/17 0551  BP:  (!) 125/56  Pulse: (!) 120 (!) 56  Resp:  20  Temp:  98.4 F (36.9 C)   Skin clean, dry and intact without evidence of skin break down, no evidence of skin tears noted. IV catheter discontinued intact. Site without signs and symptoms of complications - no redness or edema noted at insertion site, patient denies c/o pain - only slight tenderness at site.  Dressing with slight pressure applied.  D/c Instructions-Education: Discharge instructions given to patient/family with verbalized understanding. D/c education completed with patient/family including follow up instructions, medication list, d/c activities limitations if indicated, with other d/c instructions as indicated by MD - patient able to verbalize understanding, all questions fully  answered. Patient instructed to return to ED, call 911, or call MD for any changes in condition.  Patient escorted via [place], and D/C home via private auto.  Salley Slaughter, RN 02/23/2017 12:49 PM

## 2017-02-26 LAB — FOLATE RBC
FOLATE, RBC: 958 ng/mL (ref >498)
Folate, Hemolysate: 395.8 ng/mL
HEMATOCRIT: 41.3 % (ref 34.0–46.6)

## 2017-02-26 LAB — VITAMIN B1: Vitamin B1 (Thiamine): 135.6 nmol/L (ref 66.5–200.0)

## 2017-02-26 NOTE — Discharge Summary (Signed)
[facility] Discharge Summary   Patient: Gwendolyn Bright QZR:[id number]   PCP: Golden Circle, FNP DOB: April 27, 1941   Date of admission: 02/22/2017   Date of discharge: 02/23/2017     Discharge Diagnoses:  Principal Problem:   Ataxia Active Problems:   Anxiety and depression   UTI (urinary tract infection)   Generalized weakness   Weight loss   BBB (bundle branch block)   Protein-calorie malnutrition, severe  Admitted From: home Disposition:  home  Recommendations for Outpatient Follow-up:  1. Please follow up with PCP in 1 week   Diet recommendation: cardiac diet  Activity: The patient is advised to gradually reintroduce usual activities.  Discharge Condition: good  Code Status: full code  History of present illness: As per the H and P dictated on admission, "Gwendolyn Bright is a 76 y.o. female with medical history significant for skin cancer, anxiety/depression, unintentional weight loss/decreased oral intake presents to the emergency department with the chief complaint of sudden inability to walk/bear weight and generalized weakness. Initial evaluation includes a CT of the head without abnormality and a urinary tract infection.  Information is obtained from the husband is at the bedside as information from patient may be unreliable due to mild dementia. He reports she was in her usual state of health which is quite active until yesterday afternoon when she awakened from a nap and was unable to walk. There are no complaints of pain numbness tingling. She complain of a mild frontal headache. Husband reports she did not complain of lower extremity pain, back pain dizziness lightheadedness weekness. No complaints of chest pain palpitations shortness of breath. No recent nausea vomiting diarrhea constipation melena bright red blood per rectum. No recent travel or sick contacts. No recent falls. Husband reports a history of skin cancer and is concerned about metastasis."  Hospital  Course:  Summary of her active problems in the hospital is as following. #1. Ataxia/generalized weakness. Etiology unclear. CT of the head without acute abnormality. MRI of the brain limited but reveals no acute intracranial infarct or definite intracranial process.  no metabolic derangements, initial troponin negative.  No uti Antibiotics stopped.  PT recommended no PT follow up needed   #2. asymptomatic bacteriuria .  No reports of any dysuria frequency urgency. She was provided with Rocephin in the emergency department. Urine culture was negative  #3. Bundle branch block.  New, but no chest pain or shortnes of breath Troponin negative No further work up. Outpatient follow up as needed.  #4. Weight loss. Current weight 90 pounds. Chart review indicates this is an ongoing problem. Husband reports he only eats about 25% of her meals. Husband reports she's been anxious and depressed since her son's death 5 years ago. Chart review indicates symptoms so severe at times she has difficulty walking.  -Regular diet  5. Anxiety depression. May be related to loss of son 5 years ago. Home medications at one time included paroxetine but she could not tolerate.   All other chronic medical condition were stable during the hospitalization.  Patient was *seen by physical therapy, who recommended home health, which was arranged by Education officer, museum and case Freight forwarder. On the day of the discharge the patient's vitals were stable, and no other acute medical condition were reported by patient. the patient was felt safe to be discharge at home with family.  Procedures and Results:  none   Consultations:  none  DISCHARGE MEDICATION: Discharge Medication List as of 02/23/2017 12:47 PM  START taking these medications   Details  Amino Acids-Protein Hydrolys (FEEDING SUPPLEMENT, PRO-STAT SUGAR FREE 64,) LIQD Take 30 mLs by mouth 2 (two) times daily., Starting Sat 02/23/2017, Normal    feeding  supplement, ENSURE ENLIVE, (ENSURE ENLIVE) LIQD Take 237 mLs by mouth 2 (two) times daily between meals., Starting Sat 02/23/2017, Normal      CONTINUE these medications which have NOT CHANGED   Details  estrogens, conjugated, (PREMARIN) 0.3 MG tablet Take 0.3 mg by mouth daily as needed (hot flashes). Take daily for 21 days then do not take for 7 days., Historical Med    ibuprofen (ADVIL,MOTRIN) 200 MG tablet Take 800 mg by mouth every 6 (six) hours as needed for headache., Historical Med    Polyethyl Glycol-Propyl Glycol (SYSTANE OP) Apply 1-2 drops to eye daily as needed (dryness)., Historical Med    zolpidem (AMBIEN CR) 6.25 MG CR tablet Take 1 tablet (6.25 mg total) by mouth at bedtime as needed for sleep., Starting Mon 09/17/2016, Print       Allergies  Allergen Reactions  . Codeine Nausea And Vomiting   Discharge Instructions    Diet - low sodium heart healthy    Complete by:  As directed    Discharge instructions    Complete by:  As directed    It is important that you read following instructions as well as go over your medication list with RN to help you understand your care after this hospitalization.  Discharge Instructions: Please follow-up with PCP in one week  Please request your primary care physician to go over all Hospital Tests and Procedure/Radiological results at the follow up,  Please get all Hospital records sent to your PCP by signing hospital release before you go home.   Do not drive, operating heavy machinery, perform activities at heights, swimming or participation in water activities or provide baby sitting services ; until you have been seen by Primary Care Physician or a Neurologist and advised to do so again. Do not take more than prescribed Pain, Sleep and Anxiety Medications. You were cared for by a hospitalist during your hospital stay. If you have any questions about your discharge medications or the care you received while you were in the hospital  after you are discharged, you can call the unit and ask to speak with the hospitalist on call if the hospitalist that took care of you is not available.  Once you are discharged, your primary care physician will handle any further medical issues. Please note that NO REFILLS for any discharge medications will be authorized once you are discharged, as it is imperative that you return to your primary care physician (or establish a relationship with a primary care physician if you do not have one) for your aftercare needs so that they can reassess your need for medications and monitor your lab values. You Must read complete instructions/literature along with all the possible adverse reactions/side effects for all the Medicines you take and that have been prescribed to you. Take any new Medicines after you have completely understood and accept all the possible adverse reactions/side effects. Wear Seat belts while driving.   Increase activity slowly    Complete by:  As directed      Discharge Exam: Filed Weights   02/22/17 0847  Weight: 41.1 kg (90 lb 8 oz)   Vitals:   02/22/17 2225 02/23/17 0551  BP:  (!) 125/56  Pulse: (!) 120 (!) 56  Resp:  20  Temp:  98.4 F (36.9 C)   General: Appear in no distress, no Rash; Oral Mucosa moist. Cardiovascular: S1 and S2 Present, no Murmur, n JVD Respiratory: Bilateral Air entry present and Clear to Auscultation, no Crackles, no wheezes Abdomen: Bowel Sound present, Soft and no tenderness Extremities: no Pedal edema, no calf tenderness Neurology: Grossly no focal neuro deficit.  The results of significant diagnostics from this hospitalization (including imaging, microbiology, ancillary and laboratory) are listed below for reference.    Significant Diagnostic Studies: Ct Head Wo Contrast  Result Date: 02/21/2017 CLINICAL DATA:  Sudden onset of frontal headache and bilateral leg weakness tonight at 6 p.m. Stroke symptoms greater than 8 hours. EXAM: CT  HEAD WITHOUT CONTRAST TECHNIQUE: Contiguous axial images were obtained from the base of the skull through the vertex without intravenous contrast. COMPARISON:  03/25/2016 FINDINGS: Brain: Mild diffuse cerebral atrophy. Low-attenuation changes in the deep white matter consistent small vessel ischemia. No evidence of acute infarction, hemorrhage, hydrocephalus, extra-axial collection or mass lesion/mass effect. Vascular: No hyperdense vessel or unexpected calcification. Skull: Normal. Negative for fracture or focal lesion. Sinuses/Orbits: No acute finding. Other: No significant changes since previous study. IMPRESSION: No acute intracranial abnormalities. Chronic atrophy and small vessel ischemic changes. Electronically Signed   By: Lucienne Capers M.D.   On: 02/21/2017 23:49   Mr Brain Wo Contrast  Result Date: 02/22/2017 CLINICAL DATA:  Initial evaluation for acute generalized weakness. EXAM: MRI HEAD WITHOUT CONTRAST TECHNIQUE: Multiplanar, multiecho pulse sequences of the brain and surrounding structures were obtained without intravenous contrast. COMPARISON:  Prior CT from 02/21/2017. FINDINGS: Brain: Examination is limited as the patient was unable to tolerate the full length of the exam. As a resolved, only axial diffusion and T2 weighted sequences were performed. Diffuse prominence of the CSF containing spaces is compatible with generalized cerebral atrophy. Patchy T2 signal abnormality within the periventricular and deep white matter both cerebral hemispheres most consistent with chronic small vessel ischemic disease. Chronic microvascular ischemic changes present within the pons as well. No diffusion abnormality is seen to suggest acute ischemic infarct. Gray-white matter differentiation maintained. No areas of encephalomalacia to suggest chronic infarction identified on this limited exam. No mass lesion, midline shift or mass effect. Diffuse ventricular prominence related to global parenchymal volume  loss of hydrocephalus. No extra-axial fluid collection. Major dural sinuses are grossly patent. Vascular: Major intracranial vascular flow voids are maintained. Skull and upper cervical spine: Craniocervical junction grossly unremarkable, not well evaluated on this limited exam. Visualized bone marrow signal intensity within normal limits. No scalp soft tissue abnormality. Sinuses/Orbits: Globes oral soft tissues within normal limits. Patient is status post lens extraction bilaterally. Paranasal sinuses are clear. No mastoid effusion. Inner ear structures grossly unremarkable. Other: No other significant finding. IMPRESSION: 1. Limited study due to the patient's inability to tolerate the full length of the exam. 2. No acute intracranial infarct or definite other acute intracranial process. 3. Moderate cerebral atrophy with chronic small vessel ischemic disease. Findings were discussed with Dr. Leonides Schanz at approximately 6:45 a.m. on 02/22/2017. Electronically Signed   By: Jeannine Boga M.D.   On: 02/22/2017 07:21    Microbiology: Recent Results (from the past 240 hour(s))  Urine culture     Status: None   Collection Time: 02/22/17  2:52 AM  Result Value Ref Range Status   Specimen Description URINE, RANDOM  Final   Special Requests ADDED [id number] [phone]  Final   Culture NO GROWTH  Final   Report Status 02/23/2017 FINAL  Final     Labs: CBC:  Recent Labs Lab 02/21/17 2337 02/21/17 2350 02/22/17 0906 02/23/17 0418  WBC 9.0  --   --  6.8  NEUTROABS 3.7  --   --  3.2  HGB 13.4 13.9  --  13.3  HCT 39.8 41.0 41.3 40.6  MCV 89.6  --   --  90.6  PLT 168  --   --  427   Basic Metabolic Panel:  Recent Labs Lab 02/21/17 2337 02/21/17 2350 02/22/17 0906 02/23/17 0418  NA 140 141  --  139  K 3.6 3.5  --  3.7  CL 107 107  --  105  CO2 22  --   --  27  GLUCOSE 103* 98  --  109*  BUN 20 23*  --  22*  CREATININE 0.72 0.60  --  0.62  CALCIUM 9.6  --   --  9.3  MG  --   --  1.9  --   PHOS   --   --  4.0  --    Liver Function Tests:  Recent Labs Lab 02/21/17 2337  AST 27  ALT 13*  ALKPHOS 75  BILITOT 0.7  PROT 7.0  ALBUMIN 4.3   No results for input(s): LIPASE, AMYLASE in the last 168 hours. No results for input(s): AMMONIA in the last 168 hours. Cardiac Enzymes:  Recent Labs Lab 02/22/17 0906  TROPONINI <0.03   BNP (last 3 results) No results for input(s): BNP in the last 8760 hours. CBG:  Recent Labs Lab 02/21/17 2330  GLUCAP 96   Time spent: 35 minutes  Signed:  PATEL, PRANAV  [facility] 02/23/2017 , 11:36 PM

## 2017-02-27 LAB — VITAMIN B6: VITAMIN B6: 24.6 ug/L (ref 2.0–32.8)

## 2017-02-27 NOTE — Telephone Encounter (Signed)
Fine can schedule in mid June to establish

## 2017-06-11 ENCOUNTER — Ambulatory Visit (INDEPENDENT_AMBULATORY_CARE_PROVIDER_SITE_OTHER): Payer: Medicare HMO | Admitting: Internal Medicine

## 2017-06-11 ENCOUNTER — Encounter: Payer: Self-pay | Admitting: Internal Medicine

## 2017-06-11 VITALS — BP 108/70 | HR 65 | Temp 97.8°F | Ht 65.0 in | Wt 96.0 lb

## 2017-06-11 DIAGNOSIS — G4700 Insomnia, unspecified: Secondary | ICD-10-CM | POA: Diagnosis not present

## 2017-06-11 DIAGNOSIS — R634 Abnormal weight loss: Secondary | ICD-10-CM | POA: Diagnosis not present

## 2017-06-11 DIAGNOSIS — R232 Flushing: Secondary | ICD-10-CM | POA: Diagnosis not present

## 2017-06-11 DIAGNOSIS — Z23 Encounter for immunization: Secondary | ICD-10-CM | POA: Diagnosis not present

## 2017-06-11 MED ORDER — ESTRADIOL 0.025 MG/24HR TD PTTW: 1.0000 | MEDICATED_PATCH | TRANSDERMAL | 12 refills | Status: DC

## 2017-06-11 MED ORDER — PROGESTERONE MICRONIZED 100 MG PO CAPS: ORAL_CAPSULE | ORAL | 11 refills | Status: DC

## 2017-06-11 MED ORDER — ZOLPIDEM TARTRATE 5 MG PO TABS: 5.0000 mg | ORAL_TABLET | Freq: Every evening | ORAL | 1 refills | Status: DC | PRN

## 2017-06-11 NOTE — Progress Notes (Signed)
   Subjective:    Patient ID: Gwendolyn Bright, female    DOB: 12-06-1940, 76 y.o.   MRN: [id number]  HPI The patient is a 76 YO female here to transfer care of her ongoing medical concerns including hot flashes (prescribed premarin but could not afford, has tried otc supplement without relief, has had for many years but worse in the last 6 months), and insomnia (took [place] cr but it is not working anymore and wants to try regular ambien again, as well it is more costly, denies side effects). No other new concerns. She also needs to follow up on her weight (has been stable lately but previously was worried about weight loss, is now eating better and this has helped).   Review of Systems  Constitutional: Negative for activity change, appetite change, fatigue, fever and unexpected weight change.  HENT: Negative.   Eyes: Negative.   Respiratory: Negative.   Cardiovascular: Negative.   Gastrointestinal: Negative.   Musculoskeletal: Negative.   Skin: Negative.   Neurological: Negative for dizziness, tremors, weakness, light-headedness, numbness and headaches.  Hematological: Negative.   Psychiatric/Behavioral: Positive for sleep disturbance. Negative for behavioral problems, decreased concentration, dysphoric mood, hallucinations, self-injury and suicidal ideas.      Objective:   Physical Exam  Constitutional: She is oriented to person, place, and time. She appears well-developed and well-nourished.  HENT:  Head: Normocephalic and atraumatic.  Eyes: EOM are normal.  Neck: Normal range of motion.  Cardiovascular: Normal rate and regular rhythm.   Pulmonary/Chest: Effort normal and breath sounds normal. No respiratory distress. She has no wheezes. She has no rales.  Abdominal: Soft. Bowel sounds are normal. She exhibits no distension. There is no tenderness. There is no rebound.  Musculoskeletal: She exhibits no edema.  Neurological: She is alert and oriented to person, place, and time.  Coordination normal.  Skin: Skin is warm and dry.  Psychiatric: She has a normal mood and affect.   Vitals:   06/11/17 1431  BP: 108/70  Pulse: 65  Temp: 97.8 F (36.6 C)  TempSrc: Oral  SpO2: 100%  Weight: 96 lb (43.5 kg)  Height: 5\' 5"  (1.651 m)      Assessment & Plan:  Flu shot given at visit.

## 2017-06-11 NOTE — Patient Instructions (Signed)
We have sent in the estrogen patch to use twice a week.   We have also sent in the progesterone pills to take 1 pill daily for 5 days each month while on the patch.   We have sent in the [place].

## 2017-06-12 DIAGNOSIS — R232 Flushing: Secondary | ICD-10-CM | POA: Insufficient documentation

## 2017-06-12 DIAGNOSIS — G4700 Insomnia, unspecified: Secondary | ICD-10-CM | POA: Insufficient documentation

## 2017-06-12 NOTE — Assessment & Plan Note (Signed)
Recent labs normal. Previously prescribed premarin which she could not afford. With her uterus intact needs progesterone as well. Rx for estrogen patch and progesterone 5 days per month.

## 2017-06-12 NOTE — Assessment & Plan Note (Signed)
Weight up about 6 pounds since last visit and she is eating better. Will review records to see what evaluation has been done in the past and if additional is appropriate.

## 2017-06-12 NOTE — Assessment & Plan Note (Signed)
Will change ambien cr to [place] 5 mg nightly. Talked with her about the risks from long term usage including increased fall risk and memory change. She is willing to accept as she is not able to sleep without it and this helps her QOL.

## 2017-07-04 ENCOUNTER — Encounter: Payer: Self-pay | Admitting: Internal Medicine

## 2017-07-04 ENCOUNTER — Ambulatory Visit (INDEPENDENT_AMBULATORY_CARE_PROVIDER_SITE_OTHER): Payer: Medicare HMO | Admitting: Internal Medicine

## 2017-07-04 VITALS — BP 104/70 | HR 66 | Temp 97.7°F | Ht 65.0 in | Wt 95.0 lb

## 2017-07-04 DIAGNOSIS — R413 Other amnesia: Secondary | ICD-10-CM | POA: Diagnosis not present

## 2017-07-04 DIAGNOSIS — M79601 Pain in right arm: Secondary | ICD-10-CM

## 2017-07-04 DIAGNOSIS — Z23 Encounter for immunization: Secondary | ICD-10-CM

## 2017-07-04 MED ORDER — PREDNISONE 20 MG PO TABS: 40.0000 mg | ORAL_TABLET | Freq: Every day | ORAL | 0 refills | Status: DC

## 2017-07-04 NOTE — Patient Instructions (Signed)
We have sent in prednisone to take for the pain. Take 2 pills daily for 5 days.   It is okay to take ibuprofen 3 pills, 3 times a day.   If you need more pain medicine try tylenol which you can take 2 pills, 3 times per day.

## 2017-07-04 NOTE — Progress Notes (Signed)
   Subjective:    Patient ID: Gwendolyn Bright, female    DOB: [birth year], 76 y.o.   MRN: [id number]  HPI The patient is a 76 YO female coming in for right hand swelling and pain. She was stung by a bee about 2 weeks ago. This initially was painful and swollen and red. Since that time the wound healed and redness decreased. Still some swollen and there is a lump in her hand still. Denies fevers or chills. They also are requesting referral to neurology for her memory. They do not want to go into all the details but feel she is forgetting more often.   Review of Systems  Constitutional: Negative.   HENT: Negative.   Respiratory: Negative for cough, chest tightness and shortness of breath.   Cardiovascular: Negative for chest pain, palpitations and leg swelling.  Gastrointestinal: Negative for abdominal distention, abdominal pain, constipation, diarrhea, nausea and vomiting.  Musculoskeletal: Positive for arthralgias, joint swelling and myalgias.  Skin: Negative.   Neurological: Negative.   Psychiatric/Behavioral: Negative.       Objective:   Physical Exam  Constitutional: She is oriented to person, place, and time. She appears well-developed and well-nourished.  HENT:  Head: Normocephalic and atraumatic.  Eyes: EOM are normal.  Neck: Normal range of motion.  Cardiovascular: Normal rate and regular rhythm.   Pulmonary/Chest: Effort normal and breath sounds normal. No respiratory distress. She has no wheezes. She has no rales.  Abdominal: Soft. Bowel sounds are normal. She exhibits no distension. There is no tenderness. There is no rebound.  Musculoskeletal: She exhibits no edema.  Small nodule on the right 5th finger which is tender to the touch, some radiation to the wrist and elbow. No pain in the elbow without movement.  Neurological: She is alert and oriented to person, place, and time. Coordination normal.  Skin: Skin is warm and dry.   Vitals:   07/04/17 1544  BP: 104/70  Pulse:  66  Temp: 97.7 F (36.5 C)  TempSrc: Oral  SpO2: 99%  Weight: 95 lb (43.1 kg)  Height: 5\' 5"  (1.651 m)      Assessment & Plan:

## 2017-07-05 ENCOUNTER — Encounter: Payer: Self-pay | Admitting: Neurology

## 2017-07-05 DIAGNOSIS — M79601 Pain in right arm: Secondary | ICD-10-CM | POA: Insufficient documentation

## 2017-07-05 NOTE — Assessment & Plan Note (Signed)
Rx for prednisone for likely sprain. It is unclear if the bee sting set off an inflammatory reaction. Could also be some sprain in the tendons from the yard work she was doing at the time of the bee attack. If no resolution refer to sports medicine.

## 2017-08-14 ENCOUNTER — Other Ambulatory Visit: Payer: Self-pay | Admitting: Internal Medicine

## 2017-08-20 ENCOUNTER — Telehealth: Payer: Self-pay | Admitting: Internal Medicine

## 2017-08-20 NOTE — Telephone Encounter (Signed)
Pts husband called stating that the pt is needing a mammogram. She is having pain when touching her breast. Can orders be sent in for her?

## 2017-08-20 NOTE — Telephone Encounter (Signed)
Any kind of injury or cough recently? Recommend to try tylenol for the pain and call back if not improved in 3-4 days.

## 2017-08-20 NOTE — Telephone Encounter (Signed)
LVM informing patient of MD response and to call back with any questions  

## 2017-09-18 ENCOUNTER — Ambulatory Visit: Payer: Medicare HMO | Admitting: Internal Medicine

## 2017-09-18 ENCOUNTER — Encounter: Payer: Self-pay | Admitting: Internal Medicine

## 2017-09-18 DIAGNOSIS — G4700 Insomnia, unspecified: Secondary | ICD-10-CM

## 2017-09-18 MED ORDER — ZOLPIDEM TARTRATE 5 MG PO TABS: 5.0000 mg | ORAL_TABLET | Freq: Every day | ORAL | 5 refills | Status: DC

## 2017-09-18 NOTE — Progress Notes (Signed)
   Subjective:    Patient ID: Gwendolyn Bright, female    DOB: Jul 18, 1941, 76 y.o.   MRN: [id number]  HPI The patient is a 76 YO female coming in for follow up of her insomnia. She is taking [place] daily for her sleeping problems. This has worked well in the past. She denies any side effects. She denies feeling dizzy or falls. She denies taking too many pills of this.   Review of Systems  Constitutional: Negative.   HENT: Negative.   Eyes: Negative.   Respiratory: Negative for cough, chest tightness and shortness of breath.   Cardiovascular: Negative for chest pain, palpitations and leg swelling.  Gastrointestinal: Negative for abdominal distention, abdominal pain, constipation, diarrhea, nausea and vomiting.  Musculoskeletal: Negative.   Skin: Negative.   Neurological: Negative.   Psychiatric/Behavioral: Positive for sleep disturbance.      Objective:   Physical Exam  Constitutional: She is oriented to person, place, and time. She appears well-developed and well-nourished.  HENT:  Head: Normocephalic and atraumatic.  Eyes: EOM are normal.  Neck: Normal range of motion.  Cardiovascular: Normal rate and regular rhythm.  Pulmonary/Chest: Effort normal and breath sounds normal. No respiratory distress. She has no wheezes. She has no rales.  Abdominal: Soft. Bowel sounds are normal. She exhibits no distension. There is no tenderness. There is no rebound.  Musculoskeletal: She exhibits no edema.  Neurological: She is alert and oriented to person, place, and time. Coordination normal.  Skin: Skin is warm and dry.  Psychiatric: She has a normal mood and affect.   Vitals:   09/18/17 1010  BP: 116/70  Pulse: 71  Temp: 98 F (36.7 C)  TempSrc: Oral  SpO2: 99%  Weight: 97 lb (44 kg)  Height: 5\' 5"  (1.651 m)      Assessment & Plan:

## 2017-09-18 NOTE — Patient Instructions (Signed)
We have sent in the refills of the ambien.

## 2017-09-20 NOTE — Assessment & Plan Note (Signed)
Refilled [place] today, counseled her about the risk of dependence, memory change, increased risk of falls. She wishes to continue with daily therapy given the severe change in QOL without sleep.

## 2017-09-25 ENCOUNTER — Ambulatory Visit: Payer: Medicare HMO | Admitting: Neurology

## 2017-10-23 ENCOUNTER — Telehealth: Payer: Self-pay

## 2017-10-23 NOTE — Telephone Encounter (Signed)
PA started on CoverMyMeds KEY: [id number]

## 2017-10-25 NOTE — Telephone Encounter (Signed)
PA denied because it is too high risk of medication

## 2017-11-21 NOTE — Telephone Encounter (Signed)
Appeal was done and Ambien is approved 11/20/2017-11/21/2019

## 2017-12-03 ENCOUNTER — Encounter: Payer: Self-pay | Admitting: Internal Medicine

## 2017-12-03 ENCOUNTER — Other Ambulatory Visit (INDEPENDENT_AMBULATORY_CARE_PROVIDER_SITE_OTHER): Payer: Medicare HMO

## 2017-12-03 ENCOUNTER — Ambulatory Visit (INDEPENDENT_AMBULATORY_CARE_PROVIDER_SITE_OTHER): Payer: Medicare HMO | Admitting: Internal Medicine

## 2017-12-03 VITALS — BP 130/90 | HR 72 | Temp 97.9°F | Ht 65.0 in | Wt 103.0 lb

## 2017-12-03 DIAGNOSIS — R109 Unspecified abdominal pain: Secondary | ICD-10-CM

## 2017-12-03 LAB — COMPREHENSIVE METABOLIC PANEL
ALT: 10 U/L (ref 0–35)
AST: 20 U/L (ref 0–37)
Albumin: 4.3 g/dL (ref 3.5–5.2)
Alkaline Phosphatase: 96 U/L (ref 39–117)
BILIRUBIN TOTAL: 0.5 mg/dL (ref 0.2–1.2)
BUN: 18 mg/dL (ref 6–23)
CO2: 26 meq/L (ref 19–32)
CREATININE: 0.75 mg/dL (ref 0.40–1.20)
Calcium: 10.1 mg/dL (ref 8.4–10.5)
Chloride: 104 mEq/L (ref 96–112)
GFR: 79.2 mL/min (ref >60.00)
GLUCOSE: 88 mg/dL (ref 70–99)
Potassium: 3.7 mEq/L (ref 3.5–5.1)
Sodium: 141 mEq/L (ref 135–145)
Total Protein: 7.6 g/dL (ref 6.0–8.3)

## 2017-12-03 LAB — CBC
HCT: 42.7 % (ref 36.0–46.0)
Hemoglobin: 14.3 g/dL (ref 12.0–15.0)
MCHC: 33.4 g/dL (ref 30.0–36.0)
MCV: 90.8 fl (ref 78.0–100.0)
Platelets: 229 10*3/uL (ref 150.0–400.0)
RBC: 4.7 Mil/uL (ref 3.87–5.11)
RDW: 14.7 % (ref 11.5–15.5)
WBC: 9.3 10*3/uL (ref 4.0–10.5)

## 2017-12-03 LAB — LIPASE: LIPASE: 9 U/L — AB (ref 11.0–59.0)

## 2017-12-03 MED ORDER — PANTOPRAZOLE SODIUM 40 MG PO TBEC: 40.0000 mg | DELAYED_RELEASE_TABLET | Freq: Two times a day (BID) | ORAL | 0 refills | Status: DC

## 2017-12-03 MED ORDER — ONDANSETRON HCL 4 MG PO TABS: 4.0000 mg | ORAL_TABLET | Freq: Three times a day (TID) | ORAL | 0 refills | Status: DC | PRN

## 2017-12-03 NOTE — Progress Notes (Signed)
   Subjective:    Patient ID: Gwendolyn Bright, female    DOB: 1941-06-19, 77 y.o.   MRN: [id number]  HPI The patient is a 77 YO female coming in for stomach pain for about 1 month. Started off as more mild in the middle and left side. She did not seek care for it. About 1 week ago it worsened significantly. She is now having severe 10/10 pain since that time. She admits that it is constant. She does not seem able to describe the pain well and does not know if anything makes it worse or better. It is accompanied by nausea and she has been vomiting some as well. She is drinking some ensure. She is not sure about diarrhea or constipation but does not think so. She is drinking some fluids and eating some chicken soup mostly. She has tried ibuprofen for the pain and it did not help. Does not know if the pain gets better or worse with eating. She denies fevers or chills. They have not sought care because they were concerned about how long the wait would be at an ER to evaluate this. They waited almost 11 hours once and this has formed a strong opinion with them.   Review of Systems  Constitutional: Positive for activity change, appetite change and fatigue.  HENT: Negative.   Eyes: Negative.   Respiratory: Negative for cough, chest tightness and shortness of breath.   Cardiovascular: Negative for chest pain, palpitations and leg swelling.  Gastrointestinal: Positive for abdominal distention, abdominal pain, nausea and vomiting. Negative for anal bleeding, blood in stool, constipation and diarrhea.  Musculoskeletal: Positive for myalgias.  Skin: Negative.   Neurological: Negative.   Psychiatric/Behavioral: Negative.       Objective:   Physical Exam  Constitutional: She is oriented to person, place, and time. She appears well-developed and well-nourished. No distress.  HENT:  Head: Normocephalic and atraumatic.  Eyes: EOM are normal.  Neck: Normal range of motion.  Cardiovascular: Normal rate and  regular rhythm.  Pulmonary/Chest: Effort normal and breath sounds normal. No respiratory distress. She has no wheezes. She has no rales.  Abdominal: Soft. Bowel sounds are normal. She exhibits distension. She exhibits no mass. There is tenderness. There is guarding. There is no rebound.  Some guarding which is voluntary, abdomen exam is some inconsistent as she jumps when touching midline initially and then with some distraction she is not as tender in the same location a few minutes later. No rebound and good BS.   Musculoskeletal: She exhibits no edema.  Neurological: She is alert and oriented to person, place, and time. Coordination normal.  Skin: Skin is warm and dry.  Psychiatric: She has a normal mood and affect.  Poor historian and sometimes her answers conflict with each other and she relies on her husband to tell her story and he does not know. Appears to be in pain during the visit   Vitals:   12/03/17 1557  BP: 130/90  Pulse: 72  Temp: 97.9 F (36.6 C)  TempSrc: Oral  SpO2: 97%  Weight: 103 lb (46.7 kg)  Height: 5\' 5"  (1.651 m)      Assessment & Plan:

## 2017-12-03 NOTE — Patient Instructions (Signed)
We are checking the labs today to try to figure out a cause for the stomach pain.  Try taking tylenol 1000 mg up to 3 times per day for pain.  We have ordered a CT scan of the stomach. You will be called to schedule.

## 2017-12-04 DIAGNOSIS — R109 Unspecified abdominal pain: Secondary | ICD-10-CM | POA: Insufficient documentation

## 2017-12-04 NOTE — Assessment & Plan Note (Signed)
Severe abdominal pain going on almost 1 week, she and her husband are advised to go to the ER due to severity of the pain on exam and duration. They refuse. Will start protonix BID in case of ulcer or GERD. Advised to try tylenol as she has allergy to opioids and codeine. Rx for zofran for nausea and she does not appear dehydrated on exam. Checking CMP, CBC, lipase to evaluate source and also CT abdomen and pelvis ordered given the severity and duration of the symptoms.

## 2017-12-05 ENCOUNTER — Emergency Department (HOSPITAL_COMMUNITY): Payer: Medicare HMO

## 2017-12-05 ENCOUNTER — Emergency Department (HOSPITAL_COMMUNITY)
Admission: EM | Admit: 2017-12-05 | Discharge: 2017-12-05 | Disposition: A | Discharge status: Home / Self Care | Payer: Medicare HMO | Attending: Emergency Medicine | Admitting: Emergency Medicine

## 2017-12-05 ENCOUNTER — Encounter (HOSPITAL_COMMUNITY): Payer: Self-pay

## 2017-12-05 DIAGNOSIS — K760 Fatty (change of) liver, not elsewhere classified: Secondary | ICD-10-CM | POA: Diagnosis not present

## 2017-12-05 DIAGNOSIS — Z79899 Other long term (current) drug therapy: Secondary | ICD-10-CM | POA: Diagnosis not present

## 2017-12-05 DIAGNOSIS — K5900 Constipation, unspecified: Secondary | ICD-10-CM | POA: Diagnosis not present

## 2017-12-05 DIAGNOSIS — F329 Major depressive disorder, single episode, unspecified: Secondary | ICD-10-CM | POA: Insufficient documentation

## 2017-12-05 DIAGNOSIS — Z85828 Personal history of other malignant neoplasm of skin: Secondary | ICD-10-CM | POA: Diagnosis not present

## 2017-12-05 DIAGNOSIS — R05 Cough: Secondary | ICD-10-CM | POA: Diagnosis not present

## 2017-12-05 DIAGNOSIS — R1030 Lower abdominal pain, unspecified: Secondary | ICD-10-CM | POA: Diagnosis not present

## 2017-12-05 DIAGNOSIS — F419 Anxiety disorder, unspecified: Secondary | ICD-10-CM | POA: Diagnosis not present

## 2017-12-05 DIAGNOSIS — R079 Chest pain, unspecified: Secondary | ICD-10-CM | POA: Diagnosis not present

## 2017-12-05 DIAGNOSIS — R0789 Other chest pain: Secondary | ICD-10-CM | POA: Diagnosis not present

## 2017-12-05 LAB — CBC
HCT: 41.7 % (ref 36.0–46.0)
Hemoglobin: 13.8 g/dL (ref 12.0–15.0)
MCH: 30.1 pg (ref 26.0–34.0)
MCHC: 33.1 g/dL (ref 30.0–36.0)
MCV: 91 fL (ref 78.0–100.0)
PLATELETS: 206 10*3/uL (ref 150–400)
RBC: 4.58 MIL/uL (ref 3.87–5.11)
RDW: 14.5 % (ref 11.5–15.5)
WBC: 8.2 10*3/uL (ref 4.0–10.5)

## 2017-12-05 LAB — BASIC METABOLIC PANEL
Anion gap: 11 (ref 5–15)
BUN: 12 mg/dL (ref 6–20)
CALCIUM: 9.2 mg/dL (ref 8.9–10.3)
CO2: 24 mmol/L (ref 22–32)
CREATININE: 0.77 mg/dL (ref 0.44–1.00)
Chloride: 105 mmol/L (ref 101–111)
GFR calc Af Amer: >60 mL/min (ref >60)
Glucose, Bld: 94 mg/dL (ref 65–99)
Potassium: 3.8 mmol/L (ref 3.5–5.1)
SODIUM: 140 mmol/L (ref 135–145)

## 2017-12-05 LAB — URINALYSIS, ROUTINE W REFLEX MICROSCOPIC
BILIRUBIN URINE: NEGATIVE
Bacteria, UA: NONE SEEN
Glucose, UA: NEGATIVE mg/dL
Ketones, ur: NEGATIVE mg/dL
Nitrite: NEGATIVE
Protein, ur: NEGATIVE mg/dL
SPECIFIC GRAVITY, URINE: 1.015 (ref 1.005–1.030)
pH: 6 (ref 5.0–8.0)

## 2017-12-05 LAB — I-STAT TROPONIN, ED: Troponin i, poc: 0 ng/mL (ref 0.00–0.08)

## 2017-12-05 LAB — LIPASE, BLOOD: LIPASE: 25 U/L (ref 11–51)

## 2017-12-05 MED ORDER — IOPAMIDOL (ISOVUE-300) INJECTION 61%
INTRAVENOUS | Status: AC
  Administered 2017-12-05: 75 mL
  Filled 2017-12-05: qty 75

## 2017-12-05 MED ORDER — POLYETHYLENE GLYCOL 3350 17 GM/SCOOP PO POWD: 1.0000 | Freq: Once | ORAL | 0 refills | Status: AC

## 2017-12-05 MED ORDER — SODIUM CHLORIDE 0.9 % IV BOLUS (SEPSIS)
500.0000 mL | Freq: Once | INTRAVENOUS | Status: AC
  Administered 2017-12-05: 500 mL via INTRAVENOUS

## 2017-12-05 MED ORDER — FENTANYL CITRATE (PF) 100 MCG/2ML IJ SOLN
50.0000 ug | Freq: Once | INTRAMUSCULAR | Status: AC
  Administered 2017-12-05: 50 ug via INTRAVENOUS
  Filled 2017-12-05: qty 2

## 2017-12-05 NOTE — ED Notes (Signed)
Patient found standing next to bed with gown off and taking off blood pressure cuff and making bed.  Nurse explained have to stay in bed and rest.  Verbalized understanding. Placed patient on monitor.

## 2017-12-05 NOTE — ED Provider Notes (Signed)
[facility] EMERGENCY DEPARTMENT Provider Note   CSN: [id number] Arrival date & time: 12/05/17  1057     History   Chief Complaint Chief Complaint  Patient presents with  . Chest Pain  . Abdominal Pain    HPI Gwendolyn Bright is a 77 y.o. female.  The history is provided by the patient. No language interpreter was used.  Chest Pain   Associated symptoms include abdominal pain.  Abdominal Pain     Gwendolyn Bright is a 77 y.o. female who presents to the Emergency Department complaining of CP/AP.  She reports one month of excruciating abdominal pain radiating to her chest.  Pain is waxing and waning in nature with no alleviating or worsening factors.  Denies fevers, cough, vomiting, diarrhea.  She has constant nausea, weight gain.   She saw here PCP yesterday, who referred her to the ED but she did not present for evaluation at that time.   Past Medical History:  Diagnosis Date  . Anxiety   . Constipation   . Skin cancer   . UTI (urinary tract infection) 01/2017  . Weight loss     Patient Active Problem List   Diagnosis Date Noted  . Acute abdominal pain 12/04/2017  . Right arm pain 07/05/2017  . Insomnia 06/12/2017  . Hot flashes 06/12/2017  . Protein-calorie malnutrition, severe 02/23/2017  . Ataxia 02/22/2017  . Generalized weakness 02/22/2017  . 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 Constipation   . Weight loss   . Anxiety and depression 09/13/2016  . Loss of weight 08/07/2016  . Underweight 08/07/2016    Past Surgical History:  Procedure Laterality Date  . APPENDECTOMY    . CATARACT EXTRACTION    . skin cancer remo      OB History    No data available       Home Medications    Prior to Admission medications   Medication Sig Start Date End Date Taking? Authorizing Provider  Amino Acids-Protein Hydrolys (FEEDING SUPPLEMENT, PRO-STAT SUGAR FREE 64,) LIQD Take 30 mLs by mouth 2 (two) times daily. 02/23/17   Lavina Hamman, MD  feeding  supplement, ENSURE ENLIVE, (ENSURE ENLIVE) LIQD Take 237 mLs by mouth 2 (two) times daily between meals. 02/23/17   Lavina Hamman, MD  ibuprofen (ADVIL,MOTRIN) 200 MG tablet Take 800 mg by mouth every 6 (six) hours as needed for headache.    [provider]  ondansetron (ZOFRAN) 4 MG tablet Take 1 tablet (4 mg total) by mouth every 8 (eight) hours as needed for nausea or vomiting. 12/03/17   Hoyt Koch, MD  pantoprazole (PROTONIX) 40 MG tablet Take 1 tablet (40 mg total) by mouth 2 (two) times daily before a meal. 12/03/17   Hoyt Koch, MD  Polyethyl Glycol-Propyl Glycol (SYSTANE OP) Apply 1-2 drops to eye daily as needed (dryness).    [provider]  zolpidem (AMBIEN) 5 MG tablet Take 1 tablet (5 mg total) by mouth at bedtime. 09/18/17   Hoyt Koch, MD    Family History Family History  Problem Relation Age of Onset  . Tuberculosis Mother   . Alcohol abuse Father     Social History Social History   Tobacco Use  . Smoking status: Never Smoker  . Smokeless tobacco: Never Used  Substance Use Topics  . Alcohol use: No  . Drug use: No     Allergies   Codeine and Yellow jacket venom [bee venom]  Review of Systems Review of Systems  Cardiovascular: Positive for chest pain.  Gastrointestinal: Positive for abdominal pain.     Physical Exam Updated Vital Signs BP 109/88   Pulse 64   Temp 97.8 F (36.6 C) (Oral)   Resp 19   SpO2 99%   Physical Exam  Constitutional: She is oriented to person, place, and time. She appears well-developed and well-nourished.  HENT:  Head: Normocephalic and atraumatic.  Cardiovascular: Normal rate and regular rhythm.  No murmur heard. Pulmonary/Chest: Effort normal and breath sounds normal. No respiratory distress.  Abdominal: Soft. There is no rebound and no guarding.  Moderate diffuse abdominal tenderness, no guarding or rebound  Musculoskeletal: She exhibits no edema or tenderness.    Neurological: She is alert and oriented to person, place, and time.  Skin: Skin is warm and dry.  Psychiatric: She has a normal mood and affect. Her behavior is normal.  Nursing note and vitals reviewed.    ED Treatments / Results  Labs (all labs ordered are listed, but only abnormal results are displayed) Labs Reviewed  URINALYSIS, ROUTINE W REFLEX MICROSCOPIC - Abnormal; Notable for the following components:      Result Value   APPearance CLOUDY (*)    Hgb urine dipstick SMALL (*)    Leukocytes, UA LARGE (*)    Squamous Epithelial / LPF 6-30 (*)    Non Squamous Epithelial 6-30 (*)    All other components within normal limits  BASIC METABOLIC PANEL  CBC  LIPASE, BLOOD  I-STAT TROPONIN, ED    EKG  EKG Interpretation None       Radiology Dg Chest 2 View  Result Date: 12/05/2017 CLINICAL DATA:  Left-sided chest pain for 2 weeks with cough EXAM: CHEST - 2 VIEW COMPARISON:  None. FINDINGS: Cardiac shadow is within normal limits. Calcified breast implants are noted bilaterally. The lungs are hyperinflated consistent with COPD. Increased density is noted in the right lung apex also noted on the lateral projection. CT of the chest is recommended for further evaluation. No bony abnormality is seen. IMPRESSION: Increased density in the right lung apex suspicious for underlying mass. CT of the chest is recommended for further evaluation. Electronically Signed   By: Inez Catalina M.D.   On: 12/05/2017 12:15    Procedures Procedures (including critical care time)  Medications Ordered in ED Medications  fentaNYL (SUBLIMAZE) injection 50 mcg (50 mcg Intravenous Given 12/05/17 1419)  sodium chloride 0.9 % bolus 500 mL (500 mLs Intravenous New Bag/Given 12/05/17 1418)  iopamidol (ISOVUE-300) 61 % injection (75 mLs  Contrast Given 12/05/17 1551)     Initial Impression / Assessment and Plan / ED Course  I have reviewed the triage vital signs and the nursing notes.  Pertinent labs &  imaging results that were available during my care of the patient were reviewed by me and considered in my medical decision making (see chart for details).     Patient here for evaluation of 1 month of abdominal and chest pain.  She does have tenderness on examination without peritoneal findings.  Patient care transferred pending CT chest, abdomen, pelvis.  Final Clinical Impressions(s) / ED Diagnoses   Final diagnoses:  None    ED Discharge Orders    None       Quintella Reichert, MD 12/05/17 1625

## 2017-12-05 NOTE — ED Triage Notes (Signed)
PT sent from pcp for further evaluation of LUQ pain, left sided chest pain, and left shoulder pain x 2 weeks. PT states its waking her up in the middle of the night and she is scheduled for CT on Monday

## 2017-12-05 NOTE — ED Notes (Signed)
Patient Alert and oriented to baseline. Stable and ambulatory to baseline. Patient verbalized understanding of the discharge instructions.  Patient belongings were taken by the patient.   

## 2017-12-05 NOTE — ED Notes (Signed)
Pt c/o to nurse first about worsening CP, all of a sudden. EMT obtaining VS, will take back to triage to repeat EKG.

## 2017-12-05 NOTE — ED Provider Notes (Addendum)
  Physical Exam  BP 109/88   Pulse 64   Temp 97.8 F (36.6 C) (Oral)   Resp 19   SpO2 99%   Physical Exam  ED Course/Procedures     Procedures  MDM   Assuming care of patient from Dr. Ralene Bathe.   Patient in the ED for abdominal pain, radiating to the  Chest. Workup thus far shows Xray with a mass on the chest. CT chest/abd/pelvis is pending.  According to Dr. Ayesha Rumpf, plan is to f/u on the imaging and discuss the findings with the patient. Dispo per imaging.   Patient had no complains, no concerns from the nursing side. Will continue to monitor.   Varney Biles, MD 12/05/17 1608   5:35 PM Results of the CT scan discussed with the patient.  There is no acute findings on the CAT scan.  Abdominal exam does not have any peritoneal findings.  We will treat patient with MiraLAX.  She has WBCs in the urine, however denies any new urinary frequency, hematuria or dysuria. Strict ER return precautions have been discussed, and patient is agreeing with the plan and is comfortable with the workup done and the recommendations from the ER.    Varney Biles, MD 12/05/17 1735

## 2017-12-05 NOTE — Discharge Instructions (Signed)
Please take the medicines prescribed. See your primary care doctor in 1 week.  Please return to the ER if your symptoms worsen; you have increased pain, fevers, chills, inability to keep any medications down, confusion. Otherwise see the outpatient doctor as requested.

## 2017-12-09 ENCOUNTER — Other Ambulatory Visit: Payer: Medicare HMO

## 2017-12-16 ENCOUNTER — Ambulatory Visit: Payer: Medicare HMO | Admitting: Internal Medicine

## 2017-12-23 ENCOUNTER — Ambulatory Visit (INDEPENDENT_AMBULATORY_CARE_PROVIDER_SITE_OTHER): Payer: Medicare HMO | Admitting: Internal Medicine

## 2017-12-23 ENCOUNTER — Encounter: Payer: Self-pay | Admitting: Internal Medicine

## 2017-12-23 DIAGNOSIS — R109 Unspecified abdominal pain: Secondary | ICD-10-CM

## 2017-12-23 DIAGNOSIS — K5909 Other constipation: Secondary | ICD-10-CM | POA: Diagnosis not present

## 2017-12-23 NOTE — Progress Notes (Signed)
   Subjective:    Patient ID: Gwendolyn Bright, female    DOB: [birth year], 77 y.o.   MRN: [id number]  HPI The patient is a 77 YO female coming in for abdominal pain. She went to the ER per our advice and had CT scan which showed significant stool burden without signs of infection. She was able to take something otc to help her pass stool and is feeling improved. She denies current constipation or diarrhea. No nausea or vomiting. She denies bleeding or hemorrhoids. No dark or tarry stools. Is eating and drinking normally.   PMH, [facility], social history reviewed and updated.   Review of Systems  Constitutional: Negative.   HENT: Negative.   Eyes: Negative.   Respiratory: Negative for cough, chest tightness and shortness of breath.   Cardiovascular: Negative for chest pain, palpitations and leg swelling.  Gastrointestinal: Negative for abdominal distention, abdominal pain, constipation, diarrhea, nausea and vomiting.  Musculoskeletal: Negative.   Skin: Negative.   Neurological: Negative.   Psychiatric/Behavioral: Negative.       Objective:   Physical Exam  Constitutional: She is oriented to person, place, and time. She appears well-developed and well-nourished.  HENT:  Head: Normocephalic and atraumatic.  Eyes: EOM are normal.  Neck: Normal range of motion.  Cardiovascular: Normal rate and regular rhythm.  Pulmonary/Chest: Effort normal and breath sounds normal. No respiratory distress. She has no wheezes. She has no rales.  Abdominal: Soft. Bowel sounds are normal. She exhibits no distension. There is no tenderness. There is no rebound.  Musculoskeletal: She exhibits no edema.  Neurological: She is alert and oriented to person, place, and time. Coordination normal.  Skin: Skin is warm and dry.  Psychiatric: She has a normal mood and affect.   Vitals:   12/23/17 1302  BP: 112/74  Pulse: 66  Temp: 98.4 F (36.9 C)  TempSrc: Oral  SpO2: 97%  Weight: 100 lb 12.8 oz (45.7 kg)  Height:  5\' 5"  (1.651 m)      Assessment & Plan:

## 2017-12-23 NOTE — Patient Instructions (Signed)
We do not need any labs today.   

## 2017-12-23 NOTE — Assessment & Plan Note (Signed)
Resolved with otc laxatives and bms. She denies current constipation and we talked about how important it is to catch this early. Reviewed CT scan with them during the visit with no abnormalities. Not taking zofran anymore.

## 2017-12-23 NOTE — Assessment & Plan Note (Signed)
She denies needing something long term for this. She is using otc laxatives successfully. Will consider long term management if recurrent problems. Last colonoscopy not in our system but she has aged out with future per her report.

## 2018-03-21 ENCOUNTER — Encounter: Payer: Self-pay | Admitting: Internal Medicine

## 2018-03-21 ENCOUNTER — Ambulatory Visit (INDEPENDENT_AMBULATORY_CARE_PROVIDER_SITE_OTHER): Payer: Medicare HMO | Admitting: Internal Medicine

## 2018-03-21 ENCOUNTER — Other Ambulatory Visit (INDEPENDENT_AMBULATORY_CARE_PROVIDER_SITE_OTHER): Payer: Medicare HMO

## 2018-03-21 VITALS — BP 120/80 | HR 77 | Temp 99.2°F | Ht 65.0 in | Wt 99.0 lb

## 2018-03-21 DIAGNOSIS — Z0001 Encounter for general adult medical examination with abnormal findings: Secondary | ICD-10-CM | POA: Diagnosis not present

## 2018-03-21 DIAGNOSIS — E43 Unspecified severe protein-calorie malnutrition: Secondary | ICD-10-CM | POA: Diagnosis not present

## 2018-03-21 DIAGNOSIS — Z7189 Other specified counseling: Secondary | ICD-10-CM | POA: Insufficient documentation

## 2018-03-21 DIAGNOSIS — F5101 Primary insomnia: Secondary | ICD-10-CM | POA: Diagnosis not present

## 2018-03-21 LAB — CBC
HCT: 40.5 % (ref 36.0–46.0)
Hemoglobin: 13.3 g/dL (ref 12.0–15.0)
MCHC: 32.9 g/dL (ref 30.0–36.0)
MCV: 90.3 fl (ref 78.0–100.0)
PLATELETS: 223 10*3/uL (ref 150.0–400.0)
RBC: 4.49 Mil/uL (ref 3.87–5.11)
RDW: 15.3 % (ref 11.5–15.5)
WBC: 9.6 10*3/uL (ref 4.0–10.5)

## 2018-03-21 LAB — COMPREHENSIVE METABOLIC PANEL
ALK PHOS: 98 U/L (ref 39–117)
ALT: 14 U/L (ref 0–35)
AST: 23 U/L (ref 0–37)
Albumin: 4.4 g/dL (ref 3.5–5.2)
BILIRUBIN TOTAL: 1 mg/dL (ref 0.2–1.2)
BUN: 18 mg/dL (ref 6–23)
CALCIUM: 9.6 mg/dL (ref 8.4–10.5)
CO2: 27 meq/L (ref 19–32)
Chloride: 107 mEq/L (ref 96–112)
Creatinine, Ser: 0.73 mg/dL (ref 0.40–1.20)
GFR: 81.65 mL/min (ref >60.00)
Glucose, Bld: 114 mg/dL — ABNORMAL HIGH (ref 70–99)
Potassium: 3.8 mEq/L (ref 3.5–5.1)
Sodium: 142 mEq/L (ref 135–145)
Total Protein: 7.4 g/dL (ref 6.0–8.3)

## 2018-03-21 LAB — LIPID PANEL
CHOLESTEROL: 253 mg/dL — AB (ref 0–200)
HDL: 86.4 mg/dL (ref >39.00)
LDL Cholesterol: 157 mg/dL — ABNORMAL HIGH (ref 0–99)
NONHDL: 166.87
TRIGLYCERIDES: 51 mg/dL (ref 0.0–149.0)
Total CHOL/HDL Ratio: 3
VLDL: 10.2 mg/dL (ref 0.0–40.0)

## 2018-03-21 NOTE — Assessment & Plan Note (Signed)
Flu shot reminded. Pneumonia complete. Shingrix counseled. Tetanus declines. Colonoscopy up to date. Mammogram declines, pap smear not indicated and dexa declines. Counseled about sun safety and mole surveillance. Counseled about the dangers of distracted driving. Given 10 year screening recommendations.

## 2018-03-21 NOTE — Progress Notes (Signed)
   Subjective:    Patient ID: Gwendolyn Bright, female    DOB: March 17, 1941, 77 y.o.   MRN: [id number]  HPI Here for medicare wellness and physical, no new complaints. Please see A/P for status and treatment of chronic medical problems.   Diet: heart healthy Physical activity: sedentary Depression/mood screen: negative Hearing: intact to whispered voice, mild loss bilaterally Visual acuity: grossly normal, performs annual eye exam  ADLs: capable Fall risk: none Home safety: good Cognitive evaluation: moderate loss EOL planning: adv directives discussed  I have personally reviewed and have noted 1. The patient's medical and social history - reviewed today no changes 2. Their use of alcohol, tobacco or illicit drugs 3. Their current medications and supplements 4. The patient's functional ability including ADL's, fall risks, home safety risks and hearing or visual impairment. 5. Diet and physical activities 6. Evidence for depression or mood disorders 7. Care team reviewed and updated (available in snapshot)  Review of Systems  Constitutional: Negative.   HENT: Negative.   Eyes: Negative.   Respiratory: Negative for cough, chest tightness and shortness of breath.   Cardiovascular: Negative for chest pain, palpitations and leg swelling.  Gastrointestinal: Negative for abdominal distention, abdominal pain, constipation, diarrhea, nausea and vomiting.  Musculoskeletal: Negative.   Skin: Negative.   Neurological: Negative.   Psychiatric/Behavioral: Negative.       Objective:   Physical Exam  Constitutional: She is oriented to person, place, and time. She appears well-developed and well-nourished.  HENT:  Head: Normocephalic and atraumatic.  Eyes: EOM are normal.  Neck: Normal range of motion.  Cardiovascular: Normal rate and regular rhythm.  Pulmonary/Chest: Effort normal and breath sounds normal. No respiratory distress. She has no wheezes. She has no rales.  Abdominal: Soft.  Bowel sounds are normal. She exhibits no distension. There is no tenderness. There is no rebound.  Musculoskeletal: She exhibits no edema.  Neurological: She is alert and oriented to person, place, and time. Coordination normal.  Skin: Skin is warm and dry.  Psychiatric: She has a normal mood and affect.   Vitals:   03/21/18 1303  BP: 120/80  Pulse: 77  Temp: 99.2 F (37.3 C)  TempSrc: Oral  SpO2: 98%  Weight: 99 lb (44.9 kg)  Height: 5\' 5"  (1.651 m)      Assessment & Plan:

## 2018-03-21 NOTE — Assessment & Plan Note (Signed)
Appetite is poor but she is able to do some boost/ensure. Checking labs for complications but weight is low and BMI underweight.

## 2018-03-21 NOTE — Assessment & Plan Note (Signed)
Taking ambien 5 mg daily and this helps with sleep. Reminded about increased risk of falls, memory problems, dependence and she wishes to continue given increase in QOL.

## 2018-03-21 NOTE — Patient Instructions (Signed)
We have sent in the refills and will check the blood work today.   Health Maintenance, Female Adopting a healthy lifestyle and getting preventive care can go a long way to promote health and wellness. Talk with your health care provider about what schedule of regular examinations is right for you. This is a good chance for you to check in with your provider about disease prevention and staying healthy. In between checkups, there are plenty of things you can do on your own. Experts have done a lot of research about which lifestyle changes and preventive measures are most likely to keep you healthy. Ask your health care provider for more information. Weight and diet Eat a healthy diet  Be sure to include plenty of vegetables, fruits, low-fat dairy products, and lean protein.  Do not eat a lot of foods high in solid fats, added sugars, or salt.  Get regular exercise. This is one of the most important things you can do for your health. ? Most adults should exercise for at least 150 minutes each week. The exercise should increase your heart rate and make you sweat (moderate-intensity exercise). ? Most adults should also do strengthening exercises at least twice a week. This is in addition to the moderate-intensity exercise.  Maintain a healthy weight  Body mass index (BMI) is a measurement that can be used to identify possible weight problems. It estimates body fat based on height and weight. Your health care provider can help determine your BMI and help you achieve or maintain a healthy weight.  For females 48 years of age and older: ? A BMI below 18.5 is considered underweight. ? A BMI of 18.5 to 24.9 is normal. ? A BMI of 25 to 29.9 is considered overweight. ? A BMI of 30 and above is considered obese.  Watch levels of cholesterol and blood lipids  You should start having your blood tested for lipids and cholesterol at 79 years of age, then have this test every 5 years.  You may need to  have your cholesterol levels checked more often if: ? Your lipid or cholesterol levels are high. ? You are older than 79 years of age. ? You are at high risk for heart disease.  Cancer screening Lung Cancer  Lung cancer screening is recommended for adults 56-99 years old who are at high risk for lung cancer because of a history of smoking.  A yearly low-dose CT scan of the lungs is recommended for people who: ? Currently smoke. ? Have quit within the past 15 years. ? Have at least a 30-pack-year history of smoking. A pack year is smoking an average of one pack of cigarettes a day for 1 year.  Yearly screening should continue until it has been 15 years since you quit.  Yearly screening should stop if you develop a health problem that would prevent you from having lung cancer treatment.  Breast Cancer  Practice breast self-awareness. This means understanding how your breasts normally appear and feel.  It also means doing regular breast self-exams. Let your health care provider know about any changes, no matter how small.  If you are in your 20s or 30s, you should have a clinical breast exam (CBE) by a health care provider every 1-3 years as part of a regular health exam.  If you are 32 or older, have a CBE every year. Also consider having a breast X-ray (mammogram) every year.  If you have a family history of breast cancer, talk  to your health care provider about genetic screening.  If you are at high risk for breast cancer, talk to your health care provider about having an MRI and a mammogram every year.  Breast cancer gene (BRCA) assessment is recommended for women who have family members with BRCA-related cancers. BRCA-related cancers include: ? Breast. ? Ovarian. ? Tubal. ? Peritoneal cancers.  Results of the assessment will determine the need for genetic counseling and BRCA1 and BRCA2 testing.  Cervical Cancer Your health care provider may recommend that you be screened  regularly for cancer of the pelvic organs (ovaries, uterus, and vagina). This screening involves a pelvic examination, including checking for microscopic changes to the surface of your cervix (Pap test). You may be encouraged to have this screening done every 3 years, beginning at age 21.  For women ages 30-65, health care providers may recommend pelvic exams and Pap testing every 3 years, or they may recommend the Pap and pelvic exam, combined with testing for human papilloma virus (HPV), every 5 years. Some types of HPV increase your risk of cervical cancer. Testing for HPV may also be done on women of any age with unclear Pap test results.  Other health care providers may not recommend any screening for nonpregnant women who are considered low risk for pelvic cancer and who do not have symptoms. Ask your health care provider if a screening pelvic exam is right for you.  If you have had past treatment for cervical cancer or a condition that could lead to cancer, you need Pap tests and screening for cancer for at least 20 years after your treatment. If Pap tests have been discontinued, your risk factors (such as having a new sexual partner) need to be reassessed to determine if screening should resume. Some women have medical problems that increase the chance of getting cervical cancer. In these cases, your health care provider may recommend more frequent screening and Pap tests.  Colorectal Cancer  This type of cancer can be detected and often prevented.  Routine colorectal cancer screening usually begins at 79 years of age and continues through 79 years of age.  Your health care provider may recommend screening at an earlier age if you have risk factors for colon cancer.  Your health care provider may also recommend using home test kits to check for hidden blood in the stool.  A small camera at the end of a tube can be used to examine your colon directly (sigmoidoscopy or colonoscopy). This is  done to check for the earliest forms of colorectal cancer.  Routine screening usually begins at age 50.  Direct examination of the colon should be repeated every 5-10 years through 79 years of age. However, you may need to be screened more often if early forms of precancerous polyps or small growths are found.  Skin Cancer  Check your skin from head to toe regularly.  Tell your health care provider about any new moles or changes in moles, especially if there is a change in a mole's shape or color.  Also tell your health care provider if you have a mole that is larger than the size of a pencil eraser.  Always use sunscreen. Apply sunscreen liberally and repeatedly throughout the day.  Protect yourself by wearing long sleeves, pants, a wide-brimmed hat, and sunglasses whenever you are outside.  Heart disease, diabetes, and high blood pressure  High blood pressure causes heart disease and increases the risk of stroke. High blood pressure is more   likely to develop in: ? People who have blood pressure in the high end of the normal range (130-139/85-89 mm Hg). ? People who are overweight or obese. ? People who are African American.  If you are 90-95 years of age, have your blood pressure checked every 3-5 years. If you are 4 years of age or older, have your blood pressure checked every year. You should have your blood pressure measured twice-once when you are at a hospital or clinic, and once when you are not at a hospital or clinic. Record the average of the two measurements. To check your blood pressure when you are not at a hospital or clinic, you can use: ? An automated blood pressure machine at a pharmacy. ? A home blood pressure monitor.  If you are between 44 years and 21 years old, ask your health care provider if you should take aspirin to prevent strokes.  Have regular diabetes screenings. This involves taking a blood sample to check your fasting blood sugar level. ? If you are  at a normal weight and have a low risk for diabetes, have this test once every three years after 79 years of age. ? If you are overweight and have a high risk for diabetes, consider being tested at a younger age or more often. Preventing infection Hepatitis B  If you have a higher risk for hepatitis B, you should be screened for this virus. You are considered at high risk for hepatitis B if: ? You were born in a country where hepatitis B is common. Ask your health care provider which countries are considered high risk. ? Your parents were born in a high-risk country, and you have not been immunized against hepatitis B (hepatitis B vaccine). ? You have HIV or AIDS. ? You use needles to inject street drugs. ? You live with someone who has hepatitis B. ? You have had sex with someone who has hepatitis B. ? You get hemodialysis treatment. ? You take certain medicines for conditions, including cancer, organ transplantation, and autoimmune conditions.  Hepatitis C  Blood testing is recommended for: ? Everyone born from [birth year] through 1967. ? Anyone with known risk factors for hepatitis C.  Sexually transmitted infections (STIs)  You should be screened for sexually transmitted infections (STIs) including gonorrhea and chlamydia if: ? You are sexually active and are younger than 79 years of age. ? You are older than 79 years of age and your health care provider tells you that you are at risk for this type of infection. ? Your sexual activity has changed since you were last screened and you are at an increased risk for chlamydia or gonorrhea. Ask your health care provider if you are at risk.  If you do not have HIV, but are at risk, it may be recommended that you take a prescription medicine daily to prevent HIV infection. This is called pre-exposure prophylaxis (PrEP). You are considered at risk if: ? You are sexually active and do not regularly use condoms or know the HIV status of your  partner(s). ? You take drugs by injection. ? You are sexually active with a partner who has HIV.  Talk with your health care provider about whether you are at high risk of being infected with HIV. If you choose to begin PrEP, you should first be tested for HIV. You should then be tested every 3 months for as long as you are taking PrEP. Pregnancy  If you are premenopausal and you may  become pregnant, ask your health care provider about preconception counseling.  If you may become pregnant, take 400 to 800 micrograms (mcg) of folic acid every day.  If you want to prevent pregnancy, talk to your health care provider about birth control (contraception). Osteoporosis and menopause  Osteoporosis is a disease in which the bones lose minerals and strength with aging. This can result in serious bone fractures. Your risk for osteoporosis can be identified using a bone density scan.  If you are 40 years of age or older, or if you are at risk for osteoporosis and fractures, ask your health care provider if you should be screened.  Ask your health care provider whether you should take a calcium or vitamin D supplement to lower your risk for osteoporosis.  Menopause may have certain physical symptoms and risks.  Hormone replacement therapy may reduce some of these symptoms and risks. Talk to your health care provider about whether hormone replacement therapy is right for you. Follow these instructions at home:  Schedule regular health, dental, and eye exams.  Stay current with your immunizations.  Do not use any tobacco products including cigarettes, chewing tobacco, or electronic cigarettes.  If you are pregnant, do not drink alcohol.  If you are breastfeeding, limit how much and how often you drink alcohol.  Limit alcohol intake to no more than 1 drink per day for nonpregnant women. One drink equals 12 ounces of beer, 5 ounces of wine, or 1 ounces of hard liquor.  Do not use street  drugs.  Do not share needles.  Ask your health care provider for help if you need support or information about quitting drugs.  Tell your health care provider if you often feel depressed.  Tell your health care provider if you have ever been abused or do not feel safe at home. This information is not intended to replace advice given to you by your health care provider. Make sure you discuss any questions you have with your health care provider. Document Released: 04/02/2011 Document Revised: 02/23/2016 Document Reviewed: 06/21/2015 Elsevier Interactive Patient Education  [employer].

## 2018-03-24 ENCOUNTER — Other Ambulatory Visit: Payer: Self-pay

## 2018-03-24 MED ORDER — ZOLPIDEM TARTRATE 5 MG PO TABS: 5.0000 mg | ORAL_TABLET | Freq: Every day | ORAL | 5 refills | Status: DC

## 2018-03-24 NOTE — Telephone Encounter (Signed)
Patient husband stated that the zolpiden was never sent to [facility]. He is hoping he can get it sent there as [facility] is way to expensive.  Control database checked last refill: 03/09/2018

## 2018-05-21 ENCOUNTER — Ambulatory Visit: Payer: Medicare HMO

## 2018-06-11 ENCOUNTER — Ambulatory Visit (INDEPENDENT_AMBULATORY_CARE_PROVIDER_SITE_OTHER): Payer: Medicare HMO | Admitting: Family

## 2018-06-11 ENCOUNTER — Encounter: Payer: Self-pay | Admitting: Family

## 2018-06-11 VITALS — BP 124/80 | HR 67 | Temp 97.5°F | Ht 66.0 in

## 2018-06-11 DIAGNOSIS — M79604 Pain in right leg: Secondary | ICD-10-CM | POA: Diagnosis not present

## 2018-06-11 MED ORDER — DOXYCYCLINE HYCLATE 100 MG PO TABS: 100.0000 mg | ORAL_TABLET | Freq: Two times a day (BID) | ORAL | 0 refills | Status: DC

## 2018-06-11 MED ORDER — TETANUS-DIPHTH-ACELL PERTUSSIS 5-2-15.5 LF-MCG/0.5 IM SUSP: 0.5000 mL | Freq: Once | INTRAMUSCULAR | 0 refills | Status: AC

## 2018-06-11 MED ORDER — MUPIROCIN 2 % EX OINT: TOPICAL_OINTMENT | CUTANEOUS | 0 refills | Status: DC

## 2018-06-11 NOTE — Progress Notes (Signed)
Gwendolyn Bright is a 77 y.o. female with the following history as recorded in Epic[employer]:  Patient Active Problem List   Diagnosis Date Noted  . Encounter for general adult medical examination with abnormal findings 03/21/2018  . Insomnia 06/12/2017  . Protein-calorie malnutrition, severe 02/23/2017  . 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 Constipation   . Anxiety and depression 09/13/2016    Current Outpatient Medications  Medication Sig Dispense Refill  . feeding supplement, ENSURE ENLIVE, (ENSURE ENLIVE) LIQD Take 237 mLs by mouth 2 (two) times daily between meals. 237 mL 12  . ibuprofen (ADVIL,MOTRIN) 200 MG tablet Take 800 mg by mouth every 6 (six) hours as needed for headache.    Vladimir Faster Glycol-Propyl Glycol (SYSTANE OP) Apply 1-2 drops to eye daily as needed (dryness).    . zolpidem (AMBIEN) 5 MG tablet Take 1 tablet (5 mg total) by mouth at bedtime. 30 tablet 5  . doxycycline (VIBRA-TABS) 100 MG tablet Take 1 tablet (100 mg total) by mouth 2 (two) times daily. 20 tablet 0  . mupirocin ointment (BACTROBAN) 2 % Apply bid to affected area 22 g 0  . Tdap (ADACEL) 01-30-14.5 LF-MCG/0.5 injection Inject 0.5 mLs into the muscle once for 1 dose. 0.5 mL 0   No current facility-administered medications for this visit.     Allergies: Codeine and Yellow jacket venom [bee venom]  Past Medical History:  Diagnosis Date  . Anxiety   . Constipation   . Skin cancer   . UTI (urinary tract infection) 01/2017  . Weight loss     Past Surgical History:  Procedure Laterality Date  . APPENDECTOMY    . CATARACT EXTRACTION    . skin cancer remo      Family History  Problem Relation Age of Onset  . Tuberculosis Mother   . Alcohol abuse Father     Social History   Tobacco Use  . Smoking status: Never Smoker  . Smokeless tobacco: Never Used  Substance Use Topics  . Alcohol use: No    Subjective:  Patient is accompanied by her husband today; fell in her basement 5 days ago; cut her shin  and has been using Neosporin with no relief; worried about worsening pain/ redness; notes that it hurts to even bear weight; Unsure of date of last Tdap?; denies any fever or chills;   Objective:  Vitals:   06/11/18 1134  BP: 124/80  Pulse: 67  Temp: (!) 97.5 F (36.4 C)  TempSrc: Oral  SpO2: 99%  Height: 5\' 6"  (1.676 m)    General: Well developed, well nourished, in no acute distress  Skin : Warm and dry. 2 lacerations noted over right shin with surrounding erythema Head: Normocephalic and atraumatic  Lungs: Respirations unlabored;  Musculoskeletal: No deformities;  Extremities: No edema, cyanosis, clubbing  Vessels: Symmetric bilaterally  Neurologic: Alert and oriented; speech intact; face symmetrical; uses wheelchair- notes painful to bear weight; CNII-XII intact without focal deficit  Assessment:  1. Right leg pain     Plan:  Update x-ray of right tibia/ fibula to rule out fracture/ bone infection; Rx for Doxycycline 100 mg bid x 10 days; Rx for Bactroban apply bid to affected area; will plan to get Tdap updated at pharmacy; follow-up worse, no better.   No follow-ups on file.  Orders Placed This Encounter  Procedures  . DG Tibia/Fibula Right    Standing Status:   Future    Standing Expiration Date:   08/12/2019  Order Specific Question:   Reason for Exam (SYMPTOM  OR DIAGNOSIS REQUIRED)    Answer:   right leg pain    Order Specific Question:   Preferred imaging location?    Answer:   [facility]    Order Specific Question:   Radiology Contrast Protocol - do NOT remove file path    Answer:   [email or web address]  . Tdap vaccine greater than or equal to 7yo IM    Requested Prescriptions   Signed Prescriptions Disp Refills  . doxycycline (VIBRA-TABS) 100 MG tablet 20 tablet 0    Sig: Take 1 tablet (100 mg total) by mouth 2 (two) times daily.  . mupirocin ointment (BACTROBAN) 2 % 22 g 0    Sig: Apply bid to affected area   . Tdap (ADACEL) 01-30-14.5 LF-MCG/0.5 injection 0.5 mL 0    Sig: Inject 0.5 mLs into the muscle once for 1 dose.

## 2018-06-12 ENCOUNTER — Telehealth: Payer: Self-pay | Admitting: Family

## 2018-06-12 NOTE — Telephone Encounter (Signed)
Can you check on her today? I noticed she opted not to get her x-ray yesterday.

## 2018-06-12 NOTE — Telephone Encounter (Signed)
Spoke with patient today. She is feeling much better today and will come back to have CT scan scan done in a couple of days if she is not any better.

## 2018-06-24 ENCOUNTER — Emergency Department (HOSPITAL_COMMUNITY): Payer: Medicare HMO

## 2018-06-24 ENCOUNTER — Encounter (HOSPITAL_COMMUNITY): Payer: Self-pay | Admitting: Emergency Medicine

## 2018-06-24 ENCOUNTER — Inpatient Hospital Stay (HOSPITAL_COMMUNITY)
Admission: EM | Admit: 2018-06-24 | Discharge: 2018-06-27 | DRG: 564 | Disposition: A | Discharge status: Home / Self Care | Payer: Medicare HMO | Attending: Internal Medicine | Admitting: Internal Medicine

## 2018-06-24 DIAGNOSIS — T796XXA Traumatic ischemia of muscle, initial encounter: Principal | ICD-10-CM | POA: Diagnosis present

## 2018-06-24 DIAGNOSIS — R404 Transient alteration of awareness: Secondary | ICD-10-CM | POA: Diagnosis not present

## 2018-06-24 DIAGNOSIS — N390 Urinary tract infection, site not specified: Secondary | ICD-10-CM | POA: Diagnosis present

## 2018-06-24 DIAGNOSIS — F419 Anxiety disorder, unspecified: Secondary | ICD-10-CM

## 2018-06-24 DIAGNOSIS — L8990 Pressure ulcer of unspecified site, unspecified stage: Secondary | ICD-10-CM

## 2018-06-24 DIAGNOSIS — S299XXA Unspecified injury of thorax, initial encounter: Secondary | ICD-10-CM | POA: Diagnosis not present

## 2018-06-24 DIAGNOSIS — Z79899 Other long term (current) drug therapy: Secondary | ICD-10-CM

## 2018-06-24 DIAGNOSIS — E876 Hypokalemia: Secondary | ICD-10-CM | POA: Diagnosis present

## 2018-06-24 DIAGNOSIS — D72829 Elevated white blood cell count, unspecified: Secondary | ICD-10-CM | POA: Diagnosis present

## 2018-06-24 DIAGNOSIS — R627 Adult failure to thrive: Secondary | ICD-10-CM | POA: Diagnosis not present

## 2018-06-24 DIAGNOSIS — G4700 Insomnia, unspecified: Secondary | ICD-10-CM | POA: Diagnosis not present

## 2018-06-24 DIAGNOSIS — H109 Unspecified conjunctivitis: Secondary | ICD-10-CM | POA: Diagnosis present

## 2018-06-24 DIAGNOSIS — T1490XA Injury, unspecified, initial encounter: Secondary | ICD-10-CM

## 2018-06-24 DIAGNOSIS — E46 Unspecified protein-calorie malnutrition: Secondary | ICD-10-CM | POA: Diagnosis not present

## 2018-06-24 DIAGNOSIS — D751 Secondary polycythemia: Secondary | ICD-10-CM | POA: Diagnosis present

## 2018-06-24 DIAGNOSIS — F329 Major depressive disorder, single episode, unspecified: Secondary | ICD-10-CM | POA: Diagnosis present

## 2018-06-24 DIAGNOSIS — E43 Unspecified severe protein-calorie malnutrition: Secondary | ICD-10-CM | POA: Diagnosis present

## 2018-06-24 DIAGNOSIS — Z681 Body mass index (BMI) 19 or less, adult: Secondary | ICD-10-CM | POA: Diagnosis not present

## 2018-06-24 DIAGNOSIS — F32A Depression, unspecified: Secondary | ICD-10-CM | POA: Diagnosis present

## 2018-06-24 DIAGNOSIS — M545 Low back pain: Secondary | ICD-10-CM | POA: Diagnosis not present

## 2018-06-24 DIAGNOSIS — T796XXD Traumatic ischemia of muscle, subsequent encounter: Secondary | ICD-10-CM | POA: Diagnosis not present

## 2018-06-24 DIAGNOSIS — Z85828 Personal history of other malignant neoplasm of skin: Secondary | ICD-10-CM | POA: Diagnosis not present

## 2018-06-24 DIAGNOSIS — S3991XA Unspecified injury of abdomen, initial encounter: Secondary | ICD-10-CM | POA: Diagnosis not present

## 2018-06-24 DIAGNOSIS — W19XXXA Unspecified fall, initial encounter: Secondary | ICD-10-CM | POA: Diagnosis present

## 2018-06-24 DIAGNOSIS — S3993XA Unspecified injury of pelvis, initial encounter: Secondary | ICD-10-CM | POA: Diagnosis not present

## 2018-06-24 DIAGNOSIS — M6282 Rhabdomyolysis: Secondary | ICD-10-CM | POA: Diagnosis present

## 2018-06-24 DIAGNOSIS — F0390 Unspecified dementia without behavioral disturbance: Secondary | ICD-10-CM | POA: Diagnosis not present

## 2018-06-24 DIAGNOSIS — Z66 Do not resuscitate: Secondary | ICD-10-CM | POA: Diagnosis present

## 2018-06-24 HISTORY — DX: Unspecified dementia, unspecified severity, without behavioral disturbance, psychotic disturbance, mood disturbance, and anxiety: F03.90

## 2018-06-24 LAB — URINALYSIS, ROUTINE W REFLEX MICROSCOPIC
Bacteria, UA: NONE SEEN
Bilirubin Urine: NEGATIVE
Glucose, UA: NEGATIVE mg/dL
Ketones, ur: 80 mg/dL — AB
NITRITE: NEGATIVE
PROTEIN: 30 mg/dL — AB
SPECIFIC GRAVITY, URINE: 1.036 — AB (ref 1.005–1.030)
pH: 6 (ref 5.0–8.0)

## 2018-06-24 LAB — I-STAT CHEM 8, ED
BUN: 25 mg/dL — AB (ref 8–23)
CALCIUM ION: 0.96 mmol/L — AB (ref 1.15–1.40)
CHLORIDE: 107 mmol/L (ref 98–111)
Creatinine, Ser: 0.5 mg/dL (ref 0.44–1.00)
GLUCOSE: 120 mg/dL — AB (ref 70–99)
HCT: 48 % — ABNORMAL HIGH (ref 36.0–46.0)
Hemoglobin: 16.3 g/dL — ABNORMAL HIGH (ref 12.0–15.0)
Potassium: 2.8 mmol/L — ABNORMAL LOW (ref 3.5–5.1)
SODIUM: 145 mmol/L (ref 135–145)
TCO2: 24 mmol/L (ref 22–32)

## 2018-06-24 LAB — CBC
HEMATOCRIT: 46.2 % — AB (ref 36.0–46.0)
HEMOGLOBIN: 15.2 g/dL — AB (ref 12.0–15.0)
MCH: 29.7 pg (ref 26.0–34.0)
MCHC: 32.9 g/dL (ref 30.0–36.0)
MCV: 90.2 fL (ref 78.0–100.0)
Platelets: 214 10*3/uL (ref 150–400)
RBC: 5.12 MIL/uL — AB (ref 3.87–5.11)
RDW: 14.6 % (ref 11.5–15.5)
WBC: 13.7 10*3/uL — AB (ref 4.0–10.5)

## 2018-06-24 LAB — CK TOTAL AND CKMB (NOT AT ARMC)
CK TOTAL: 2135 U/L — AB (ref 38–234)
CK, MB: 9.9 ng/mL — ABNORMAL HIGH (ref 0.5–5.0)
Relative Index: 0.5 (ref 0.0–2.5)

## 2018-06-24 LAB — COMPREHENSIVE METABOLIC PANEL
ALK PHOS: 94 U/L (ref 38–126)
ALT: 32 U/L (ref 0–44)
ANION GAP: 18 — AB (ref 5–15)
AST: 57 U/L — ABNORMAL HIGH (ref 15–41)
Albumin: 3.6 g/dL (ref 3.5–5.0)
BILIRUBIN TOTAL: 1.9 mg/dL — AB (ref 0.3–1.2)
BUN: 22 mg/dL (ref 8–23)
CALCIUM: 9.2 mg/dL (ref 8.9–10.3)
CO2: 21 mmol/L — ABNORMAL LOW (ref 22–32)
Chloride: 106 mmol/L (ref 98–111)
Creatinine, Ser: 0.67 mg/dL (ref 0.44–1.00)
GFR calc non Af Amer: >60 mL/min (ref >60)
Glucose, Bld: 118 mg/dL — ABNORMAL HIGH (ref 70–99)
POTASSIUM: 2.8 mmol/L — AB (ref 3.5–5.1)
Sodium: 145 mmol/L (ref 135–145)
TOTAL PROTEIN: 7.8 g/dL (ref 6.5–8.1)

## 2018-06-24 LAB — PROTIME-INR
INR: 1.13
Prothrombin Time: 14.4 seconds (ref 11.4–15.2)

## 2018-06-24 LAB — SAMPLE TO BLOOD BANK

## 2018-06-24 LAB — I-STAT TROPONIN, ED: Troponin i, poc: 0.01 ng/mL (ref 0.00–0.08)

## 2018-06-24 LAB — I-STAT CG4 LACTIC ACID, ED: LACTIC ACID, VENOUS: 2.76 mmol/L — AB (ref 0.5–1.9)

## 2018-06-24 LAB — ETHANOL

## 2018-06-24 LAB — CDS SEROLOGY

## 2018-06-24 MED ORDER — POTASSIUM CHLORIDE 20 MEQ/15ML (10%) PO SOLN
20.0000 meq | Freq: Every day | ORAL | Status: DC
  Administered 2018-06-25: 20 meq via ORAL
  Filled 2018-06-24: qty 15

## 2018-06-24 MED ORDER — IOHEXOL 300 MG/ML  SOLN
100.0000 mL | Freq: Once | INTRAMUSCULAR | Status: AC | PRN
  Administered 2018-06-24: 100 mL via INTRAVENOUS

## 2018-06-24 MED ORDER — SODIUM CHLORIDE 0.9 % IV BOLUS
500.0000 mL | Freq: Once | INTRAVENOUS | Status: AC
  Administered 2018-06-24: 500 mL via INTRAVENOUS

## 2018-06-24 MED ORDER — SODIUM CHLORIDE 0.9 % IV SOLN
Freq: Once | INTRAVENOUS | Status: AC
  Administered 2018-06-24: 23:00:00 via INTRAVENOUS

## 2018-06-24 MED ORDER — POTASSIUM CHLORIDE CRYS ER 20 MEQ PO TBCR: 20.0000 meq | EXTENDED_RELEASE_TABLET | Freq: Three times a day (TID) | ORAL | Status: DC

## 2018-06-24 MED ORDER — SODIUM CHLORIDE 0.9 % IV BOLUS: 125.0000 mL | Freq: Once | INTRAVENOUS | Status: DC

## 2018-06-24 NOTE — ED Triage Notes (Signed)
Patient arrived with EMS from home found on the floor by family this evening , pt. has dementia - poor historian/unable to specify pain , towel splint applied by EMS at neck , CBG= 104 by EMS .

## 2018-06-24 NOTE — ED Notes (Signed)
Purewick placed on pt. 

## 2018-06-24 NOTE — ED Provider Notes (Signed)
[facility] EMERGENCY DEPARTMENT Provider Note   CSN: [id number] Arrival date & time: 06/24/18  1944      History   Chief Complaint Chief Complaint  Patient presents with  . Fall    HPI Gwendolyn Bright is a 77 y.o. female.  HPI  77 year old female with PMH significant for severe dementia, presents after being found down by husband.  Patient unable to contribute to history.  History provided by husband at bedside.  Over the last 2 weeks patient is getting progressively weaker, requiring assistance for ambulation, refusing to eat, with stable mental status.  Husband works morning found patient on the ground, for which she was unable to pick her up due to pain.  Patient downtime roughly 9 hours.  EMS was called to transport patient to hospital.  Husband denies recent fevers, cough, congestion or runny nose.  Past Medical History:  Diagnosis Date  . Anxiety   . Constipation   . Dementia   . Skin cancer   . UTI (urinary tract infection) 01/2017  . Weight loss     Patient Active Problem List   Diagnosis Date Noted  . Hypokalemia 06/24/2018  . Dementia 06/24/2018  . Failure to thrive in adult 06/24/2018  . Rhabdomyolysis 06/24/2018  . Leukocytosis 06/24/2018  . Encounter for general adult medical examination with abnormal findings 03/21/2018  . Insomnia 06/12/2017  . Protein-calorie malnutrition, severe 02/23/2017  . 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 Constipation   . Anxiety and depression 09/13/2016    Past Surgical History:  Procedure Laterality Date  . APPENDECTOMY    . CATARACT EXTRACTION    . skin cancer remo       OB History   None      Home Medications    Prior to Admission medications   Medication Sig Start Date End Date Taking? Authorizing Provider  Polyethyl Glycol-Propyl Glycol (SYSTANE OP) Apply 1-2 drops to eye daily as needed (dryness).   Yes [provider]  zolpidem (AMBIEN) 5 MG tablet Take 1 tablet (5 mg  total) by mouth at bedtime. Patient taking differently: Take 5 mg by mouth at bedtime as needed for sleep.  04/07/18  Yes Hoyt Koch, MD  doxycycline (VIBRA-TABS) 100 MG tablet Take 1 tablet (100 mg total) by mouth 2 (two) times daily. Patient not taking: Reported on 06/24/2018 06/11/18   Marrian Salvage, FNP  feeding supplement, ENSURE ENLIVE, (ENSURE ENLIVE) LIQD Take 237 mLs by mouth 2 (two) times daily between meals. Patient not taking: Reported on 06/24/2018 02/23/17   Lavina Hamman, MD  mupirocin ointment Drue Stager) 2 % Apply bid to affected area Patient not taking: Reported on 06/24/2018 06/11/18   Marrian Salvage, FNP    Family History Family History  Problem Relation Age of Onset  . Tuberculosis Mother   . Alcohol abuse Father     Social History Social History   Tobacco Use  . Smoking status: Never Smoker  . Smokeless tobacco: Never Used  Substance Use Topics  . Alcohol use: No  . Drug use: No     Allergies   Codeine and Yellow jacket venom [bee venom]   Review of Systems Review of Systems  Constitutional: Negative for fever.  Respiratory: Negative for cough.   Gastrointestinal: Positive for abdominal pain.  All other systems reviewed and are negative.    Physical Exam Updated Vital Signs BP (!) 154/73   Pulse 62   Temp 98.1 F (36.7 C) (  Axillary)   Resp 14   SpO2 96%   Physical Exam  Constitutional: No distress.  Cachectic  HENT:  Head: Normocephalic and atraumatic.  Eyes: Left eye exhibits discharge. Left eye exhibits no chemosis. Left conjunctiva is injected. Left conjunctiva has no hemorrhage.    Neck: Neck supple.  Cardiovascular: Normal rate and regular rhythm.  No murmur heard. Pulmonary/Chest: Effort normal and breath sounds normal. No respiratory distress.  Abdominal: Soft. There is generalized tenderness. There is no rigidity, no rebound and no guarding.  Musculoskeletal: She exhibits no edema.       Back:        Legs: Patient TTP decompression bilateral hips, hip stable.  No obvious step-offs or deformities of total spine.  Neurological: She is alert. She is disoriented. GCS eye subscore is 4. GCS verbal subscore is 5. GCS motor subscore is 6.  PERRLA, EOMI, patient moving all 4 extremities  Skin: Skin is warm and dry. Capillary refill takes 2 to 3 seconds. She is not diaphoretic.  Psychiatric: She has a normal mood and affect.  Nursing note and vitals reviewed.    ED Treatments / Results  Labs (all labs ordered are listed, but only abnormal results are displayed) Labs Reviewed  COMPREHENSIVE METABOLIC PANEL - Abnormal; Notable for the following components:      Result Value   Potassium 2.8 (*)    CO2 21 (*)    Glucose, Bld 118 (*)    AST 57 (*)    Total Bilirubin 1.9 (*)    Anion gap 18 (*)    All other components within normal limits  CBC - Abnormal; Notable for the following components:   WBC 13.7 (*)    RBC 5.12 (*)    Hemoglobin 15.2 (*)    HCT 46.2 (*)    All other components within normal limits  CK TOTAL AND CKMB (NOT AT [facility]) - Abnormal; Notable for the following components:   Total CK 2,135 (*)    CK, MB 9.9 (*)    All other components within normal limits  I-STAT CHEM 8, ED - Abnormal; Notable for the following components:   Potassium 2.8 (*)    BUN 25 (*)    Glucose, Bld 120 (*)    Calcium, Ion 0.96 (*)    Hemoglobin 16.3 (*)    HCT 48.0 (*)    All other components within normal limits  I-STAT CG4 LACTIC ACID, ED - Abnormal; Notable for the following components:   Lactic Acid, Venous 2.76 (*)    All other components within normal limits  CDS SEROLOGY  ETHANOL  PROTIME-INR  URINALYSIS, ROUTINE W REFLEX MICROSCOPIC  I-STAT TROPONIN, ED  SAMPLE TO BLOOD BANK    EKG EKG Interpretation  Date/Time:  Tuesday June 24 2018 20:38:01 EDT Ventricular Rate:  73 PR Interval:    QRS Duration: 78 QT Interval:  378 QTC Calculation: 417 R Axis:   86 Text  Interpretation:  Sinus rhythm Borderline right axis deviation Minimal ST depression, diffuse leads st depression is new.  widened qrs resolved. Confirmed by Isla Pence [phone]) on 06/24/2018 8:45:23 PM   Radiology Ct Head Wo Contrast  Result Date: 06/24/2018 CLINICAL DATA:  77 year old female with dementia. EXAM: CT HEAD WITHOUT CONTRAST CT CERVICAL SPINE WITHOUT CONTRAST TECHNIQUE: Multidetector CT imaging of the head and cervical spine was performed following the standard protocol without intravenous contrast. Multiplanar CT image reconstructions of the cervical spine were also generated. COMPARISON:  Brain MRI dated 02/22/2017 FINDINGS:  CT HEAD FINDINGS Brain: Mild to moderate age-related atrophy and chronic microvascular ischemic changes. There is no acute intracranial hemorrhage. No mass effect or midline shift. No extra-axial fluid collection. Vascular: No hyperdense vessel or unexpected calcification. Skull: Normal. Negative for fracture or focal lesion. Sinuses/Orbits: No acute finding. Other: None CT CERVICAL SPINE FINDINGS Alignment: No acute subluxation. Skull base and vertebrae: No acute fracture. No primary bone lesion or focal pathologic process. Soft tissues and spinal canal: No prevertebral fluid or swelling. No visible canal hematoma. Disc levels:  Mild degenerative changes. Upper chest: Biapical subpleural scarring. Other: None IMPRESSION: 1. No acute intracranial hemorrhage. 2. Age-related atrophy and chronic microvascular ischemic changes. 3. No acute/traumatic cervical spine pathology. Electronically Signed   By: Anner Crete M.D.   On: 06/24/2018 22:04   Ct Chest W Contrast  Result Date: 06/24/2018 CLINICAL DATA:  Blunt trauma. History of dementia. Patient found on the floor by family this evening. EXAM: CT CHEST, ABDOMEN, AND PELVIS WITH CONTRAST TECHNIQUE: Multidetector CT imaging of the chest, abdomen and pelvis was performed following the standard protocol during bolus  administration of intravenous contrast. CONTRAST:  156mL OMNIPAQUE IOHEXOL 300 MG/ML  SOLN COMPARISON:  12/05/2017 chest CT, CT abdomen and pelvis 12/05/2017 FINDINGS: CT CHEST FINDINGS CARDIOVASCULAR: Heart size is normal. No pericardial effusion. Nonaneurysmal atherosclerotic thoracic aorta. No mediastinal hematoma. No large central pulmonary embolus. Great vessels are unremarkable and patent. MEDIASTINUM/NODES: Unremarkable thyroid gland. No lymphadenopathy by CT size criteria. Patent trachea and mainstem bronchi. CT appearance of the esophagus is unremarkable. No hilar lymphadenopathy is noted. Midline patent trachea and mainstem bronchi. LUNGS/PLEURA: No acute pulmonary consolidation, effusion or pneumothorax. No dominant mass is seen. Scarring at the apices with right upper lobe bronchiectasis and dystrophic calcifications. Lesser degree of apical pleuroparenchymal scarring with calcifications on the left. No pulmonary contusion, effusion or pneumothorax. MUSCULOSKELETAL: Calcified subglandular bilateral breast implants. No acute osseous appearing abnormality is noted. CT ABDOMEN AND PELVIS FINDINGS HEPATIC BILIARY: Steatosis of the liver. No liver laceration or subcapsular fluid. Physiologic distention of the gallbladder without stones. No biliary dilatation is identified. PANCREAS: No pancreatic mass, inflammation or ductal dilatation. SPLEEN: Normal size spleen without mass. ADRENALS/URINARY TRACT: Normal bilateral adrenal glands and kidneys. No enhancing mass lesion of either kidney. Stable 8 mm cortical cyst in the lower pole the right kidney. No nephrolithiasis nor obstructive uropathy. The ureters are not dilated. No hydroureteronephrosis is noted. Distended urinary bladder without focal mural thickening or calculus. STOMACH/BOWEL: The stomach, small and large bowel are normal in course and caliber without mural thickening or inflammation. No mechanical bowel obstruction is noted. Appendectomy.  VASCULAR/LYMPHATIC: Nonaneurysmal thoracic aorta. No lymphadenopathy by CT size criteria. REPRODUCTIVE: Hysterectomy.  No adnexal mass. OTHER: No free air nor free fluid. MUSCULOSKELETAL: Mild dextroconvex curvature of the lumbar spine. No acute nor suspicious osseous lesions. IMPRESSION: 1. No acute chest, abdomen or pelvic abnormality. 2. Chronic stable pleuroparenchymal changes and scarring at the apices of both lungs, right middle lobe and lingula. 3. No acute solid nor hollow visceral organ injury. 4. Mild hepatic steatosis. Electronically Signed   By: Ashley Royalty M.D.   On: 06/24/2018 22:08   Ct Cervical Spine Wo Contrast  Result Date: 06/24/2018 CLINICAL DATA:  77 year old female with dementia. EXAM: CT HEAD WITHOUT CONTRAST CT CERVICAL SPINE WITHOUT CONTRAST TECHNIQUE: Multidetector CT imaging of the head and cervical spine was performed following the standard protocol without intravenous contrast. Multiplanar CT image reconstructions of the cervical spine were also generated. COMPARISON:  Brain MRI dated 02/22/2017 FINDINGS: CT HEAD FINDINGS Brain: Mild to moderate age-related atrophy and chronic microvascular ischemic changes. There is no acute intracranial hemorrhage. No mass effect or midline shift. No extra-axial fluid collection. Vascular: No hyperdense vessel or unexpected calcification. Skull: Normal. Negative for fracture or focal lesion. Sinuses/Orbits: No acute finding. Other: None CT CERVICAL SPINE FINDINGS Alignment: No acute subluxation. Skull base and vertebrae: No acute fracture. No primary bone lesion or focal pathologic process. Soft tissues and spinal canal: No prevertebral fluid or swelling. No visible canal hematoma. Disc levels:  Mild degenerative changes. Upper chest: Biapical subpleural scarring. Other: None IMPRESSION: 1. No acute intracranial hemorrhage. 2. Age-related atrophy and chronic microvascular ischemic changes. 3. No acute/traumatic cervical spine pathology.  Electronically Signed   By: Anner Crete M.D.   On: 06/24/2018 22:04   Ct Abdomen Pelvis W Contrast  Result Date: 06/24/2018 CLINICAL DATA:  Blunt trauma. History of dementia. Patient found on the floor by family this evening. EXAM: CT CHEST, ABDOMEN, AND PELVIS WITH CONTRAST TECHNIQUE: Multidetector CT imaging of the chest, abdomen and pelvis was performed following the standard protocol during bolus administration of intravenous contrast. CONTRAST:  129mL OMNIPAQUE IOHEXOL 300 MG/ML  SOLN COMPARISON:  12/05/2017 chest CT, CT abdomen and pelvis 12/05/2017 FINDINGS: CT CHEST FINDINGS CARDIOVASCULAR: Heart size is normal. No pericardial effusion. Nonaneurysmal atherosclerotic thoracic aorta. No mediastinal hematoma. No large central pulmonary embolus. Great vessels are unremarkable and patent. MEDIASTINUM/NODES: Unremarkable thyroid gland. No lymphadenopathy by CT size criteria. Patent trachea and mainstem bronchi. CT appearance of the esophagus is unremarkable. No hilar lymphadenopathy is noted. Midline patent trachea and mainstem bronchi. LUNGS/PLEURA: No acute pulmonary consolidation, effusion or pneumothorax. No dominant mass is seen. Scarring at the apices with right upper lobe bronchiectasis and dystrophic calcifications. Lesser degree of apical pleuroparenchymal scarring with calcifications on the left. No pulmonary contusion, effusion or pneumothorax. MUSCULOSKELETAL: Calcified subglandular bilateral breast implants. No acute osseous appearing abnormality is noted. CT ABDOMEN AND PELVIS FINDINGS HEPATIC BILIARY: Steatosis of the liver. No liver laceration or subcapsular fluid. Physiologic distention of the gallbladder without stones. No biliary dilatation is identified. PANCREAS: No pancreatic mass, inflammation or ductal dilatation. SPLEEN: Normal size spleen without mass. ADRENALS/URINARY TRACT: Normal bilateral adrenal glands and kidneys. No enhancing mass lesion of either kidney. Stable 8 mm  cortical cyst in the lower pole the right kidney. No nephrolithiasis nor obstructive uropathy. The ureters are not dilated. No hydroureteronephrosis is noted. Distended urinary bladder without focal mural thickening or calculus. STOMACH/BOWEL: The stomach, small and large bowel are normal in course and caliber without mural thickening or inflammation. No mechanical bowel obstruction is noted. Appendectomy. VASCULAR/LYMPHATIC: Nonaneurysmal thoracic aorta. No lymphadenopathy by CT size criteria. REPRODUCTIVE: Hysterectomy.  No adnexal mass. OTHER: No free air nor free fluid. MUSCULOSKELETAL: Mild dextroconvex curvature of the lumbar spine. No acute nor suspicious osseous lesions. IMPRESSION: 1. No acute chest, abdomen or pelvic abnormality. 2. Chronic stable pleuroparenchymal changes and scarring at the apices of both lungs, right middle lobe and lingula. 3. No acute solid nor hollow visceral organ injury. 4. Mild hepatic steatosis. Electronically Signed   By: Ashley Royalty M.D.   On: 06/24/2018 22:08   Dg Pelvis Portable  Result Date: 06/24/2018 CLINICAL DATA:  Unwitnessed fall. EXAM: PORTABLE PELVIS 1-2 VIEWS COMPARISON:  Radiographs of May 30, 2008. FINDINGS: There is no evidence of pelvic fracture or diastasis. No pelvic bone lesions are seen. IMPRESSION: Negative. Electronically Signed   By: Marijo Conception, M.D.  On: 06/24/2018 20:54   Ct T-spine No Charge  Result Date: 06/24/2018 CLINICAL DATA:  Patient found down on floor by family. Blunt trauma. EXAM: CT THORACIC SPINE WITHOUT CONTRAST TECHNIQUE: Multidetector CT images of the thoracic were obtained using the standard protocol without intravenous contrast. COMPARISON:  None. FINDINGS: Alignment: Normal. Vertebrae: No acute fracture or focal pathologic process. Twelve paired thoracic ribs without acute fracture of the visualized portions. Paraspinal and other soft tissues: Visible canal hematoma. No suspicious osseous lesions. No paraspinal soft  tissue mass or hematoma. Disc levels: No focal disc herniation or significant foraminal or canal stenosis. IMPRESSION: No acute osseous abnormality of the thoracic spine. Electronically Signed   By: Ashley Royalty M.D.   On: 06/24/2018 22:12   Ct L-spine No Charge  Result Date: 06/24/2018 CLINICAL DATA:  Patient found on floor by family.  Lower back pain. EXAM: CT LUMBAR SPINE WITHOUT CONTRAST TECHNIQUE: Multidetector CT imaging of the lumbar spine was performed without intravenous contrast administration. Multiplanar CT image reconstructions were also generated. COMPARISON:  None. FINDINGS: Segmentation: Standard Alignment: Maintained lumbar lordosis. No pars defects or listhesis. Vertebrae: No acute fracture or focal pathologic process. Paraspinal and other soft tissues: Negative. Disc levels: No significant central or foraminal stenosis. Slight disc space narrowing and vacuum phenomenon at L1-2 and L2-3. IMPRESSION: No acute lumbar spine fracture or listhesis. 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 Electronically Signed   By: Ashley Royalty M.D.   On: 06/24/2018 22:14   Dg Chest Port 1 View  Result Date: 06/24/2018 CLINICAL DATA:  Fall. EXAM: PORTABLE CHEST 1 VIEW COMPARISON:  Radiographs and CT scan of December 05, 2017. FINDINGS: The heart size and mediastinal contours are within normal limits. Both lungs are clear. No pneumothorax or pleural effusion is noted. The visualized skeletal structures are unremarkable. IMPRESSION: No acute cardiopulmonary abnormality seen. Electronically Signed   By: Marijo Conception, M.D.   On: 06/24/2018 20:53    Procedures Procedures (including critical care time)  Medications Ordered in ED Medications  potassium chloride SA (K-DUR,KLOR-CON) CR tablet 20 mEq (has no administration in time range)  sodium chloride 0.9 % bolus 500 mL (0 mLs Intravenous Stopped 06/24/18 2310)  iohexol (OMNIPAQUE) 300 MG/ML solution 100 mL (100 mLs Intravenous Contrast Given 06/24/18 2111)    0.9 %  sodium chloride infusion ( Intravenous New Bag/Given 06/24/18 2325)     Initial Impression / Assessment and Plan / ED Course  I have reviewed the triage vital signs and the nursing notes.  Pertinent labs & imaging results that were available during my care of the patient were reviewed by me and considered in my medical decision making (see chart for details).     77 year old female with PMH significant for severe dementia, presents after being found down by husband.  Patient unable to contribute to history.  History as above.  Patient also has findings of bacterial conjunctivitis of the left eye.  ABCs intact  Secondary reveal tenderness in areas as above.  Appropriate imaging performed.  Patient given half liter IV fluids.  Patient denies pain, no pain medication given.  Labs and imaging reveal hypokalemia 2.8.  Patient given p.o. Potassium.  CK 2135.  Patient in rhabdomyolysis.  Pending UA.  Lactic acid 2.76.  Vision with mild anion gap metabolic acidosis thought secondary to lactic acidosis.  Patient with mild leukocytosis no current evidence of infection.  Imaging negative for acute fracture or injury.  C-spine imaging negative.  Patient without persistent tenderness  on exam.  C-spine cleared.  Patient with signs of the conductive Vitas.  Advised with Romycin ointment and I.  Medicated plan with primary admitting team.  Patient admitted to the hospitalist for further management of rhabdomyolysis and follow-up of UA.   Final Clinical Impressions(s) / ED Diagnoses   Final diagnoses:  Fall, initial encounter  Traumatic rhabdomyolysis, initial encounter ([place])  Conjunctivitis of left eye, unspecified conjunctivitis type    ED Discharge Orders    None       Keenan Bachelor, MD 06/24/18 [id number]    Isla Pence, MD 06/24/18 [phone]

## 2018-06-25 DIAGNOSIS — T796XXD Traumatic ischemia of muscle, subsequent encounter: Secondary | ICD-10-CM | POA: Diagnosis not present

## 2018-06-25 DIAGNOSIS — D751 Secondary polycythemia: Secondary | ICD-10-CM | POA: Diagnosis present

## 2018-06-25 DIAGNOSIS — Z681 Body mass index (BMI) 19 or less, adult: Secondary | ICD-10-CM | POA: Diagnosis not present

## 2018-06-25 DIAGNOSIS — E46 Unspecified protein-calorie malnutrition: Secondary | ICD-10-CM | POA: Diagnosis present

## 2018-06-25 DIAGNOSIS — G4700 Insomnia, unspecified: Secondary | ICD-10-CM | POA: Diagnosis present

## 2018-06-25 DIAGNOSIS — F0390 Unspecified dementia without behavioral disturbance: Secondary | ICD-10-CM | POA: Diagnosis present

## 2018-06-25 DIAGNOSIS — R627 Adult failure to thrive: Secondary | ICD-10-CM | POA: Diagnosis present

## 2018-06-25 DIAGNOSIS — N390 Urinary tract infection, site not specified: Secondary | ICD-10-CM | POA: Diagnosis present

## 2018-06-25 DIAGNOSIS — E876 Hypokalemia: Secondary | ICD-10-CM | POA: Diagnosis present

## 2018-06-25 DIAGNOSIS — E43 Unspecified severe protein-calorie malnutrition: Secondary | ICD-10-CM | POA: Diagnosis present

## 2018-06-25 DIAGNOSIS — Z79899 Other long term (current) drug therapy: Secondary | ICD-10-CM | POA: Diagnosis not present

## 2018-06-25 DIAGNOSIS — L8990 Pressure ulcer of unspecified site, unspecified stage: Secondary | ICD-10-CM

## 2018-06-25 DIAGNOSIS — H109 Unspecified conjunctivitis: Secondary | ICD-10-CM | POA: Diagnosis present

## 2018-06-25 DIAGNOSIS — T796XXA Traumatic ischemia of muscle, initial encounter: Secondary | ICD-10-CM | POA: Diagnosis present

## 2018-06-25 DIAGNOSIS — W19XXXA Unspecified fall, initial encounter: Secondary | ICD-10-CM | POA: Diagnosis present

## 2018-06-25 DIAGNOSIS — Z85828 Personal history of other malignant neoplasm of skin: Secondary | ICD-10-CM | POA: Diagnosis not present

## 2018-06-25 DIAGNOSIS — Z66 Do not resuscitate: Secondary | ICD-10-CM | POA: Diagnosis present

## 2018-06-25 LAB — CK: Total CK: 1949 U/L — ABNORMAL HIGH (ref 38–234)

## 2018-06-25 LAB — CBC WITH DIFFERENTIAL/PLATELET
ABS IMMATURE GRANULOCYTES: 0.1 10*3/uL (ref 0.0–0.1)
Basophils Absolute: 0 10*3/uL (ref 0.0–0.1)
Basophils Relative: 0 %
EOS PCT: 0 %
Eosinophils Absolute: 0 10*3/uL (ref 0.0–0.7)
HEMATOCRIT: 42.2 % (ref 36.0–46.0)
HEMOGLOBIN: 13.8 g/dL (ref 12.0–15.0)
IMMATURE GRANULOCYTES: 1 %
LYMPHS ABS: 1.8 10*3/uL (ref 0.7–4.0)
LYMPHS PCT: 14 %
MCH: 29.3 pg (ref 26.0–34.0)
MCHC: 32.7 g/dL (ref 30.0–36.0)
MCV: 89.6 fL (ref 78.0–100.0)
Monocytes Absolute: 1.4 10*3/uL — ABNORMAL HIGH (ref 0.1–1.0)
Monocytes Relative: 11 %
NEUTROS ABS: 9.7 10*3/uL — AB (ref 1.7–7.7)
NEUTROS PCT: 74 %
Platelets: 190 10*3/uL (ref 150–400)
RBC: 4.71 MIL/uL (ref 3.87–5.11)
RDW: 14.6 % (ref 11.5–15.5)
WBC: 13 10*3/uL — AB (ref 4.0–10.5)

## 2018-06-25 LAB — BASIC METABOLIC PANEL
Anion gap: 9 (ref 5–15)
BUN: 16 mg/dL (ref 8–23)
CHLORIDE: 110 mmol/L (ref 98–111)
CO2: 23 mmol/L (ref 22–32)
Calcium: 8.6 mg/dL — ABNORMAL LOW (ref 8.9–10.3)
Creatinine, Ser: 0.68 mg/dL (ref 0.44–1.00)
GFR calc Af Amer: >60 mL/min (ref >60)
GFR calc non Af Amer: >60 mL/min (ref >60)
Glucose, Bld: 179 mg/dL — ABNORMAL HIGH (ref 70–99)
POTASSIUM: 3.7 mmol/L (ref 3.5–5.1)
SODIUM: 142 mmol/L (ref 135–145)

## 2018-06-25 LAB — LACTIC ACID, PLASMA: LACTIC ACID, VENOUS: 1.9 mmol/L (ref 0.5–1.9)

## 2018-06-25 LAB — GLUCOSE, CAPILLARY: Glucose-Capillary: 109 mg/dL — ABNORMAL HIGH (ref 70–99)

## 2018-06-25 MED ORDER — ONDANSETRON HCL 4 MG/2ML IJ SOLN: 4.0000 mg | Freq: Four times a day (QID) | INTRAMUSCULAR | Status: DC | PRN

## 2018-06-25 MED ORDER — ACETAMINOPHEN 325 MG PO TABS: 650.0000 mg | ORAL_TABLET | Freq: Four times a day (QID) | ORAL | Status: DC | PRN

## 2018-06-25 MED ORDER — ZOLPIDEM TARTRATE 5 MG PO TABS
5.0000 mg | ORAL_TABLET | Freq: Every evening | ORAL | Status: DC | PRN
  Administered 2018-06-25 – 2018-06-26 (×3): 5 mg via ORAL
  Filled 2018-06-25 (×3): qty 1

## 2018-06-25 MED ORDER — POTASSIUM CHLORIDE IN NACL 40-0.9 MEQ/L-% IV SOLN
INTRAVENOUS | Status: AC
  Administered 2018-06-25: 110 mL/h via INTRAVENOUS
  Filled 2018-06-25 (×2): qty 1000

## 2018-06-25 MED ORDER — POTASSIUM CHLORIDE 20 MEQ/15ML (10%) PO SOLN
20.0000 meq | Freq: Once | ORAL | Status: AC
  Administered 2018-06-25: 20 meq via ORAL
  Filled 2018-06-25: qty 15

## 2018-06-25 MED ORDER — SENNOSIDES-DOCUSATE SODIUM 8.6-50 MG PO TABS: 1.0000 | ORAL_TABLET | Freq: Every evening | ORAL | Status: DC | PRN

## 2018-06-25 MED ORDER — MAGNESIUM SULFATE 2 GM/50ML IV SOLN
2.0000 g | Freq: Once | INTRAVENOUS | Status: AC
  Administered 2018-06-25: 2 g via INTRAVENOUS
  Filled 2018-06-25: qty 50

## 2018-06-25 MED ORDER — ENSURE ENLIVE PO LIQD
237.0000 mL | Freq: Two times a day (BID) | ORAL | Status: DC
  Administered 2018-06-25 – 2018-06-27 (×4): 237 mL via ORAL

## 2018-06-25 MED ORDER — POLYVINYL ALCOHOL 1.4 % OP SOLN: 1.0000 [drp] | Freq: Every day | OPHTHALMIC | Status: DC | PRN

## 2018-06-25 MED ORDER — SODIUM CHLORIDE 0.9 % IV SOLN
1.0000 g | INTRAVENOUS | Status: DC
  Administered 2018-06-25 – 2018-06-27 (×3): 1 g via INTRAVENOUS
  Filled 2018-06-25 (×3): qty 10

## 2018-06-25 MED ORDER — ACETAMINOPHEN 650 MG RE SUPP: 650.0000 mg | Freq: Four times a day (QID) | RECTAL | Status: DC | PRN

## 2018-06-25 MED ORDER — ONDANSETRON HCL 4 MG PO TABS: 4.0000 mg | ORAL_TABLET | Freq: Four times a day (QID) | ORAL | Status: DC | PRN

## 2018-06-25 MED ORDER — ENOXAPARIN SODIUM 30 MG/0.3ML ~~LOC~~ SOLN
30.0000 mg | SUBCUTANEOUS | Status: DC
  Administered 2018-06-25 – 2018-06-26 (×2): 30 mg via SUBCUTANEOUS
  Filled 2018-06-25 (×2): qty 0.3

## 2018-06-25 MED ORDER — HYDROCODONE-ACETAMINOPHEN 5-325 MG PO TABS: 1.0000 | ORAL_TABLET | ORAL | Status: DC | PRN

## 2018-06-25 NOTE — H&P (Signed)
History and Physical    Gwendolyn Bright IRC:[id number] DOB: December 23, 1940 DOA: 06/24/2018  PCP: Hoyt Koch, MD   Patient coming from: Home   Chief Complaint: Gen weakness, anorexia, too weak to get up from floor   HPI: Gwendolyn Bright is a 77 y.o. female with medical history significant for dementia, anxiety, and insomnia, presenting to the emergency department for evaluation of generalized weakness and anorexia.  History is obtained from the patient's husband at the bedside with patient unable to contribute secondary to her clinical condition with advanced dementia.  Patient has reportedly been growing increasingly weak in general over the past several months and has had decreased appetite, worsening in recent weeks.  She lives at home with her husband and requires his assistance for ADLs and more recently has required help getting out of bed due to generalized weakness.  She was found on the floor when her husband woke this morning, she was too weak to get up despite his attempts to help her, and so she remained on the floor throughout the day.  This evening, she remained too weak to get up despite her husband's attempts to help and EMS was called.  Patient has not had any complaints.  She has eaten more than a couple bites in the past week.  ED Course: Upon arrival to the ED, patient is found to be afebrile, saturating low 90s on room air, slightly tachycardic, and with stable blood pressure.  EKG features sinus rhythm with minimal ST depression involving diffuse leads.  Chest x-ray is negative for acute cardiopulmonary disease and radiographs of the pelvis are negative.  CT head is negative for acute intracranial abnormality and no acute findings are noted on cervical spine CT.  Patient also underwent CTA of chest, abdomen, and pelvis with no acute pathology identified.  Chemistry panel is notable for a potassium of 2.8 and bilirubin 1.9.  CK is elevated to 2135.  CBC is notable for leukocytosis  to 13,700 and a mild polycythemia.  Lactic acid is elevated to 2.76.  Troponin is negative.  Patient was given 500 cc normal saline in the ED and will be observed on the telemetry unit for ongoing evaluation and management of failure to thrive with generalized weakness, anorexia, and mild rhabdomyolysis.  Review of Systems:  All other systems reviewed and apart from HPI, are negative.  Past Medical History:  Diagnosis Date  . Anxiety   . Constipation   . Dementia   . Skin cancer   . UTI (urinary tract infection) 01/2017  . Weight loss     Past Surgical History:  Procedure Laterality Date  . APPENDECTOMY    . CATARACT EXTRACTION    . skin cancer remo       reports that she has never smoked. She has never used smokeless tobacco. She reports that she does not drink alcohol or use drugs.  Allergies  Allergen Reactions  . Codeine Nausea And Vomiting  . [place] Venom [Bee Venom] Swelling    Family History  Problem Relation Age of Onset  . Tuberculosis Mother   . Alcohol abuse Father      Prior to Admission medications   Medication Sig Start Date End Date Taking? Authorizing Provider  Polyethyl Glycol-Propyl Glycol (SYSTANE OP) Apply 1-2 drops to eye daily as needed (dryness).   Yes [provider]  zolpidem (AMBIEN) 5 MG tablet Take 1 tablet (5 mg total) by mouth at bedtime. Patient taking differently: Take 5 mg  by mouth at bedtime as needed for sleep.  04/07/18  Yes Hoyt Koch, MD  feeding supplement, ENSURE ENLIVE, (ENSURE ENLIVE) LIQD Take 237 mLs by mouth 2 (two) times daily between meals. Patient not taking: Reported on 06/24/2018 02/23/17   Lavina Hamman, MD    Physical Exam: Vitals:   06/24/18 2300 06/24/18 2315 06/24/18 2345 06/25/18 0025  BP: (!) 132/96 (!) 154/73 (!) 137/57 (!) 143/108  Pulse: 64 62 73 75  Resp: (!) 21 14 15 18   Temp:      TempSrc:      SpO2: 100% 96% (!) 78%      Constitutional: NAD, calm, cachectic  Eyes:  PERTLA, lids and conjunctivae normal ENMT: Mucous membranes are moist. Posterior pharynx clear of any exudate or lesions.   Neck: normal, supple, no masses, no thyromegaly Respiratory: clear to auscultation bilaterally, no wheezing, no crackles. Normal respiratory effort.  Cardiovascular: S1 & S2 heard, regular rate and rhythm. No extremity edema.   Abdomen: No distension, no tenderness, soft. Bowel sounds normal.  Musculoskeletal: no clubbing / cyanosis. No joint deformity upper and lower extremities.   Skin: no significant rashes, lesions, ulcers. Poor turgor. Neurologic: No facial asymmetry. Sensation intact. Moving all extremities.  Psychiatric: Alert. Disoriented. Calm.    Labs on Admission: I have personally reviewed following labs and imaging studies  CBC: Recent Labs  Lab 06/24/18 2039 06/24/18 2045  WBC 13.7*  --   HGB 15.2* 16.3*  HCT 46.2* 48.0*  MCV 90.2  --   PLT 214  --    Basic Metabolic Panel: Recent Labs  Lab 06/24/18 2039 06/24/18 2045  NA 145 145  K 2.8* 2.8*  CL 106 107  CO2 21*  --   GLUCOSE 118* 120*  BUN 22 25*  CREATININE 0.67 0.50  CALCIUM 9.2  --    GFR: CrCl cannot be calculated (Unknown ideal weight.). Liver Function Tests: Recent Labs  Lab 06/24/18 2039  AST 57*  ALT 32  ALKPHOS 94  BILITOT 1.9*  PROT 7.8  ALBUMIN 3.6   No results for input(s): LIPASE, AMYLASE in the last 168 hours. No results for input(s): AMMONIA in the last 168 hours. Coagulation Profile: Recent Labs  Lab 06/24/18 2039  INR 1.13   Cardiac Enzymes: Recent Labs  Lab 06/24/18 2039  CKTOTAL 2,135*  CKMB 9.9*   BNP (last 3 results) No results for input(s): PROBNP in the last 8760 hours. HbA1C: No results for input(s): HGBA1C in the last 72 hours. CBG: No results for input(s): GLUCAP in the last 168 hours. Lipid Profile: No results for input(s): CHOL, HDL, LDLCALC, TRIG, CHOLHDL, LDLDIRECT in the last 72 hours. Thyroid Function Tests: No results for  input(s): TSH, T4TOTAL, FREET4, T3FREE, THYROIDAB in the last 72 hours. Anemia Panel: No results for input(s): VITAMINB12, FOLATE, FERRITIN, TIBC, IRON, RETICCTPCT in the last 72 hours. Urine analysis:    Component Value Date/Time   COLORURINE YELLOW 06/24/2018 2324   APPEARANCEUR CLEAR 06/24/2018 2324   LABSPEC 1.036 (H) 06/24/2018 2324   PHURINE 6.0 06/24/2018 2324   GLUCOSEU NEGATIVE 06/24/2018 2324   HGBUR MODERATE (A) 06/24/2018 2324   BILIRUBINUR NEGATIVE 06/24/2018 2324   KETONESUR 80 (A) 06/24/2018 2324   PROTEINUR 30 (A) 06/24/2018 2324   UROBILINOGEN 0.2 01/09/2015 1301   NITRITE NEGATIVE 06/24/2018 2324   LEUKOCYTESUR SMALL (A) 06/24/2018 2324   Sepsis Labs: @LABRCNTIP (procalcitonin:4,lacticidven:4) )No results found for this or any previous visit (from the past 240 hour(s)).  Radiological Exams on Admission: Ct Head Wo Contrast  Result Date: 06/24/2018 CLINICAL DATA:  77 year old female with dementia. EXAM: CT HEAD WITHOUT CONTRAST CT CERVICAL SPINE WITHOUT CONTRAST TECHNIQUE: Multidetector CT imaging of the head and cervical spine was performed following the standard protocol without intravenous contrast. Multiplanar CT image reconstructions of the cervical spine were also generated. COMPARISON:  Brain MRI dated 02/22/2017 FINDINGS: CT HEAD FINDINGS Brain: Mild to moderate age-related atrophy and chronic microvascular ischemic changes. There is no acute intracranial hemorrhage. No mass effect or midline shift. No extra-axial fluid collection. Vascular: No hyperdense vessel or unexpected calcification. Skull: Normal. Negative for fracture or focal lesion. Sinuses/Orbits: No acute finding. Other: None CT CERVICAL SPINE FINDINGS Alignment: No acute subluxation. Skull base and vertebrae: No acute fracture. No primary bone lesion or focal pathologic process. Soft tissues and spinal canal: No prevertebral fluid or swelling. No visible canal hematoma. Disc levels:  Mild degenerative  changes. Upper chest: Biapical subpleural scarring. Other: None IMPRESSION: 1. No acute intracranial hemorrhage. 2. Age-related atrophy and chronic microvascular ischemic changes. 3. No acute/traumatic cervical spine pathology. Electronically Signed   By: Anner Crete M.D.   On: 06/24/2018 22:04   Ct Chest W Contrast  Result Date: 06/24/2018 CLINICAL DATA:  Blunt trauma. History of dementia. Patient found on the floor by family this evening. EXAM: CT CHEST, ABDOMEN, AND PELVIS WITH CONTRAST TECHNIQUE: Multidetector CT imaging of the chest, abdomen and pelvis was performed following the standard protocol during bolus administration of intravenous contrast. CONTRAST:  180mL OMNIPAQUE IOHEXOL 300 MG/ML  SOLN COMPARISON:  12/05/2017 chest CT, CT abdomen and pelvis 12/05/2017 FINDINGS: CT CHEST FINDINGS CARDIOVASCULAR: Heart size is normal. No pericardial effusion. Nonaneurysmal atherosclerotic thoracic aorta. No mediastinal hematoma. No large central pulmonary embolus. Great vessels are unremarkable and patent. MEDIASTINUM/NODES: Unremarkable thyroid gland. No lymphadenopathy by CT size criteria. Patent trachea and mainstem bronchi. CT appearance of the esophagus is unremarkable. No hilar lymphadenopathy is noted. Midline patent trachea and mainstem bronchi. LUNGS/PLEURA: No acute pulmonary consolidation, effusion or pneumothorax. No dominant mass is seen. Scarring at the apices with right upper lobe bronchiectasis and dystrophic calcifications. Lesser degree of apical pleuroparenchymal scarring with calcifications on the left. No pulmonary contusion, effusion or pneumothorax. MUSCULOSKELETAL: Calcified subglandular bilateral breast implants. No acute osseous appearing abnormality is noted. CT ABDOMEN AND PELVIS FINDINGS HEPATIC BILIARY: Steatosis of the liver. No liver laceration or subcapsular fluid. Physiologic distention of the gallbladder without stones. No biliary dilatation is identified. PANCREAS: No  pancreatic mass, inflammation or ductal dilatation. SPLEEN: Normal size spleen without mass. ADRENALS/URINARY TRACT: Normal bilateral adrenal glands and kidneys. No enhancing mass lesion of either kidney. Stable 8 mm cortical cyst in the lower pole the right kidney. No nephrolithiasis nor obstructive uropathy. The ureters are not dilated. No hydroureteronephrosis is noted. Distended urinary bladder without focal mural thickening or calculus. STOMACH/BOWEL: The stomach, small and large bowel are normal in course and caliber without mural thickening or inflammation. No mechanical bowel obstruction is noted. Appendectomy. VASCULAR/LYMPHATIC: Nonaneurysmal thoracic aorta. No lymphadenopathy by CT size criteria. REPRODUCTIVE: Hysterectomy.  No adnexal mass. OTHER: No free air nor free fluid. MUSCULOSKELETAL: Mild dextroconvex curvature of the lumbar spine. No acute nor suspicious osseous lesions. IMPRESSION: 1. No acute chest, abdomen or pelvic abnormality. 2. Chronic stable pleuroparenchymal changes and scarring at the apices of both lungs, right middle lobe and lingula. 3. No acute solid nor hollow visceral organ injury. 4. Mild hepatic steatosis. Electronically Signed   By: Ashley Royalty  M.D.   On: 06/24/2018 22:08   Ct Cervical Spine Wo Contrast  Result Date: 06/24/2018 CLINICAL DATA:  77 year old female with dementia. EXAM: CT HEAD WITHOUT CONTRAST CT CERVICAL SPINE WITHOUT CONTRAST TECHNIQUE: Multidetector CT imaging of the head and cervical spine was performed following the standard protocol without intravenous contrast. Multiplanar CT image reconstructions of the cervical spine were also generated. COMPARISON:  Brain MRI dated 02/22/2017 FINDINGS: CT HEAD FINDINGS Brain: Mild to moderate age-related atrophy and chronic microvascular ischemic changes. There is no acute intracranial hemorrhage. No mass effect or midline shift. No extra-axial fluid collection. Vascular: No hyperdense vessel or unexpected  calcification. Skull: Normal. Negative for fracture or focal lesion. Sinuses/Orbits: No acute finding. Other: None CT CERVICAL SPINE FINDINGS Alignment: No acute subluxation. Skull base and vertebrae: No acute fracture. No primary bone lesion or focal pathologic process. Soft tissues and spinal canal: No prevertebral fluid or swelling. No visible canal hematoma. Disc levels:  Mild degenerative changes. Upper chest: Biapical subpleural scarring. Other: None IMPRESSION: 1. No acute intracranial hemorrhage. 2. Age-related atrophy and chronic microvascular ischemic changes. 3. No acute/traumatic cervical spine pathology. Electronically Signed   By: Anner Crete M.D.   On: 06/24/2018 22:04   Ct Abdomen Pelvis W Contrast  Result Date: 06/24/2018 CLINICAL DATA:  Blunt trauma. History of dementia. Patient found on the floor by family this evening. EXAM: CT CHEST, ABDOMEN, AND PELVIS WITH CONTRAST TECHNIQUE: Multidetector CT imaging of the chest, abdomen and pelvis was performed following the standard protocol during bolus administration of intravenous contrast. CONTRAST:  169mL OMNIPAQUE IOHEXOL 300 MG/ML  SOLN COMPARISON:  12/05/2017 chest CT, CT abdomen and pelvis 12/05/2017 FINDINGS: CT CHEST FINDINGS CARDIOVASCULAR: Heart size is normal. No pericardial effusion. Nonaneurysmal atherosclerotic thoracic aorta. No mediastinal hematoma. No large central pulmonary embolus. Great vessels are unremarkable and patent. MEDIASTINUM/NODES: Unremarkable thyroid gland. No lymphadenopathy by CT size criteria. Patent trachea and mainstem bronchi. CT appearance of the esophagus is unremarkable. No hilar lymphadenopathy is noted. Midline patent trachea and mainstem bronchi. LUNGS/PLEURA: No acute pulmonary consolidation, effusion or pneumothorax. No dominant mass is seen. Scarring at the apices with right upper lobe bronchiectasis and dystrophic calcifications. Lesser degree of apical pleuroparenchymal scarring with  calcifications on the left. No pulmonary contusion, effusion or pneumothorax. MUSCULOSKELETAL: Calcified subglandular bilateral breast implants. No acute osseous appearing abnormality is noted. CT ABDOMEN AND PELVIS FINDINGS HEPATIC BILIARY: Steatosis of the liver. No liver laceration or subcapsular fluid. Physiologic distention of the gallbladder without stones. No biliary dilatation is identified. PANCREAS: No pancreatic mass, inflammation or ductal dilatation. SPLEEN: Normal size spleen without mass. ADRENALS/URINARY TRACT: Normal bilateral adrenal glands and kidneys. No enhancing mass lesion of either kidney. Stable 8 mm cortical cyst in the lower pole the right kidney. No nephrolithiasis nor obstructive uropathy. The ureters are not dilated. No hydroureteronephrosis is noted. Distended urinary bladder without focal mural thickening or calculus. STOMACH/BOWEL: The stomach, small and large bowel are normal in course and caliber without mural thickening or inflammation. No mechanical bowel obstruction is noted. Appendectomy. VASCULAR/LYMPHATIC: Nonaneurysmal thoracic aorta. No lymphadenopathy by CT size criteria. REPRODUCTIVE: Hysterectomy.  No adnexal mass. OTHER: No free air nor free fluid. MUSCULOSKELETAL: Mild dextroconvex curvature of the lumbar spine. No acute nor suspicious osseous lesions. IMPRESSION: 1. No acute chest, abdomen or pelvic abnormality. 2. Chronic stable pleuroparenchymal changes and scarring at the apices of both lungs, right middle lobe and lingula. 3. No acute solid nor hollow visceral organ injury. 4. Mild hepatic steatosis. Electronically Signed  By: Ashley Royalty M.D.   On: 06/24/2018 22:08   Dg Pelvis Portable  Result Date: 06/24/2018 CLINICAL DATA:  Unwitnessed fall. EXAM: PORTABLE PELVIS 1-2 VIEWS COMPARISON:  Radiographs of May 30, 2008. FINDINGS: There is no evidence of pelvic fracture or diastasis. No pelvic bone lesions are seen. IMPRESSION: Negative. Electronically Signed    By: Marijo Conception, M.D.   On: 06/24/2018 20:54   Ct T-spine No Charge  Result Date: 06/24/2018 CLINICAL DATA:  Patient found down on floor by family. Blunt trauma. EXAM: CT THORACIC SPINE WITHOUT CONTRAST TECHNIQUE: Multidetector CT images of the thoracic were obtained using the standard protocol without intravenous contrast. COMPARISON:  None. FINDINGS: Alignment: Normal. Vertebrae: No acute fracture or focal pathologic process. Twelve paired thoracic ribs without acute fracture of the visualized portions. Paraspinal and other soft tissues: Visible canal hematoma. No suspicious osseous lesions. No paraspinal soft tissue mass or hematoma. Disc levels: No focal disc herniation or significant foraminal or canal stenosis. IMPRESSION: No acute osseous abnormality of the thoracic spine. Electronically Signed   By: Ashley Royalty M.D.   On: 06/24/2018 22:12   Ct L-spine No Charge  Result Date: 06/24/2018 CLINICAL DATA:  Patient found on floor by family.  Lower back pain. EXAM: CT LUMBAR SPINE WITHOUT CONTRAST TECHNIQUE: Multidetector CT imaging of the lumbar spine was performed without intravenous contrast administration. Multiplanar CT image reconstructions were also generated. COMPARISON:  None. FINDINGS: Segmentation: Standard Alignment: Maintained lumbar lordosis. No pars defects or listhesis. Vertebrae: No acute fracture or focal pathologic process. Paraspinal and other soft tissues: Negative. Disc levels: No significant central or foraminal stenosis. Slight disc space narrowing and vacuum phenomenon at L1-2 and L2-3. IMPRESSION: No acute lumbar spine fracture or listhesis. 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 Electronically Signed   By: Ashley Royalty M.D.   On: 06/24/2018 22:14   Dg Chest Port 1 View  Result Date: 06/24/2018 CLINICAL DATA:  Fall. EXAM: PORTABLE CHEST 1 VIEW COMPARISON:  Radiographs and CT scan of December 05, 2017. FINDINGS: The heart size and mediastinal contours are within normal  limits. Both lungs are clear. No pneumothorax or pleural effusion is noted. The visualized skeletal structures are unremarkable. IMPRESSION: No acute cardiopulmonary abnormality seen. Electronically Signed   By: Marijo Conception, M.D.   On: 06/24/2018 20:53    EKG: Independently reviewed. Sinus rhythm, minimal ST-depression involving diffuse leads.   Assessment/Plan  1. Rhabdomyolysis  - Presents with generalized weakness and anorexia after spending the night and most of the day on the floor, too weak to get up despite husband's assistance  - Patient has advanced dementia and not complaining of anything  - No acute findings on imaging of head, neck, chest, abdomen, pelvis   - CK elevated to 2135 with preserved renal function   - She was treated with 500 cc NS in ED  - Continue IVF hydration, repeat chem panel and CK level in am   2. Hypokalemia  - Serum potassium is 2.8 on admission  - No reported vomiting or diarrhea, likely secondary to poor nutrition  - KCl added to IVF and oral potassium will be given, empiric mag given  - Continue cardiac monitoring for now, repeat chem panel in am    3. Dementia; failure to thrive  - Husband reports a gradual decline with decreasing appetite and now no appetite for the past couple weeks  - Patient has been increasingly weak in general and requiring husband's assistance to get  out of bed  - Husband suspects this is secondary to advancing dementia, would like to continue caring for her at home, but feels he may need help  - Continue supportive care, consult with PT and case manager     4. Leukocytosis  - WBC is 13,700 and lactic acid 2.176  - There is no fever, and no evidence for infection on UA, CXR, or CT abd/pelvis - Suspect this reflects dehydration and hemoconcentration and H&H are also elevated  - Continue IVF hydration, repeat lactate, repeat CBC in am, culture if febrile    DVT prophylaxis: Lovenox  Code Status: DNR  Family  Communication: Husband updated at bedside Consults called: None Admission status: Observation   Vianne Bulls, MD [facility] Pager [phone]  If 7PM-7AM, please contact night-coverage [email or web address] Password [email or web address]  06/25/2018, 1:17 AM

## 2018-06-25 NOTE — Progress Notes (Signed)
Patient arrived at appx 0015 alert and oriented to self but confused otherwise. Patient not cooperative with care and was aggressive verbally and physically. Once patient was settled in she was much calmer. Patient grabbing at tele and IV. Mittens were placed. Patient resting quietly at this time without s/s of pain or distress.

## 2018-06-25 NOTE — Evaluation (Signed)
Physical Therapy Evaluation Patient Details Name: Gwendolyn Bright MRN: [id number] DOB: [birth year] Today's Date: 06/25/2018   History of Present Illness  Pt is a 77 y.o. F with significant PMH of dementia, anxiety, insomnia, presenting to ED for evaluation of generalized weakness and anorexia  Clinical Impression  No family/caregiver present to determine PLOF; per chart review, patient husband provides assistance for ADL's and is available 24/7. Patient is alert and oriented to self only; she follows simple commands inconsistently with multimodal cueing. Requiring minimal-moderate assistance for transferring from bed to chair. Presenting with decreased functional mobility secondary to generalized weakness, cognitive deficits, decreased activity tolerance, and balance impairments. Recommending SNF at discharge to decrease caregiver burden. Will follow acutely to progress mobility.     Follow Up Recommendations SNF;Supervision/Assistance - 24 hour    Equipment Recommendations  Other (comment)(TBD)    Recommendations for Other Services       Precautions / Restrictions Precautions Precautions: Fall Restrictions Weight Bearing Restrictions: No      Mobility  Bed Mobility Overal bed mobility: Needs Assistance Bed Mobility: Supine to Sit     Supine to sit: Mod assist     General bed mobility comments: Mod assist provided to elevate trunk and use of bed pad used to scoot hips forward to edge of bed. Multimodal cueing utilized to initiate movement  Transfers Overall transfer level: Needs assistance Equipment used: None Transfers: Sit to/from [facility] Sit to Stand: Mod assist Stand pivot transfers: Min assist       General transfer comment: Mod assist to boost up to standing from edge of bed. Patient demonstrating increased knee flexion/trunk flexion in standing. Min assist for stand pivot, with patient reaching for chair arms for external  support  Ambulation/Gait                Stairs            Wheelchair Mobility    Modified Rankin (Stroke Patients Only)       Balance Overall balance assessment: Needs assistance Sitting-balance support: Feet unsupported;No upper extremity supported Sitting balance-Leahy Scale: Fair     Standing balance support: No upper extremity supported;During functional activity Standing balance-Leahy Scale: Fair                               Pertinent Vitals/Pain Pain Assessment: Faces Faces Pain Scale: No hurt    Home Living Family/patient expects to be discharged to:: Unsure Living Arrangements: Spouse/significant other               Additional Comments: No family/caregiver present and pt unable to provide information    Prior Function Level of Independence: Needs assistance      ADL's / Homemaking Assistance Needed: per chart review, pt husband providing increased assistance for ADL's and pt also needs help getting out of bed        Hand Dominance        Extremity/Trunk Assessment   Upper Extremity Assessment Upper Extremity Assessment: Generalized weakness    Lower Extremity Assessment Lower Extremity Assessment: Generalized weakness       Communication   Communication: No difficulties  Cognition Arousal/Alertness: Awake/alert Behavior During Therapy: WFL for tasks assessed/performed Overall Cognitive Status: History of cognitive impairments - at baseline  General Comments: Advanced dementia at baseline. A&O to self only. Follows simple commands inconsistently       General Comments      Exercises     Assessment/Plan    PT Assessment Patient needs continued PT services  PT Problem List Decreased strength;Decreased activity tolerance;Decreased balance;Decreased mobility;Decreased coordination;Decreased cognition       PT Treatment Interventions DME instruction;Gait  training;Functional mobility training;Therapeutic activities;Therapeutic exercise;Balance training;Patient/family education    PT Goals (Current goals can be found in the Care Plan section)  Acute Rehab PT Goals Patient Stated Goal: pt unable to state; agreeable to get up to chair for breakfast PT Goal Formulation: Patient unable to participate in goal setting Time For Goal Achievement: 07/09/18 Potential to Achieve Goals: Fair    Frequency Min 2X/week   Barriers to discharge        Co-evaluation               AM-PAC PT "6 Clicks" Daily Activity  Outcome Measure Difficulty turning over in bed (including adjusting bedclothes, sheets and blankets)?: A Little Difficulty moving from lying on back to sitting on the side of the bed? : Unable Difficulty sitting down on and standing up from a chair with arms (e.g., wheelchair, bedside commode, etc,.)?: Unable Help needed moving to and from a bed to chair (including a wheelchair)?: A Little Help needed walking in hospital room?: A Little Help needed climbing 3-5 steps with a railing? : A Lot 6 Click Score: 13    End of Session Equipment Utilized During Treatment: Gait belt Activity Tolerance: Patient tolerated treatment well Patient left: in chair;with call bell/phone within reach;with chair alarm set;with nursing/sitter in room Nurse Communication: Mobility status PT Visit Diagnosis: Unsteadiness on feet (R26.81);Muscle weakness (generalized) (M62.81);Difficulty in walking, not elsewhere classified (R26.2)    Time: 1771-1657 PT Time Calculation (min) (ACUTE ONLY): 22 min   Charges:   PT Evaluation $PT Eval Moderate Complexity: 1 Mod          Ellamae Sia, [place], DPT Acute Rehabilitation Services Pager [phone] Office [phone]   Willy Eddy 06/25/2018, 10:04 AM

## 2018-06-25 NOTE — Progress Notes (Signed)
PROGRESS NOTE                                                                                                                                                                                                             Patient Demographics:    Gwendolyn Bright, is a 77 y.o. female, DOB - 02/13/1941, DQQ:[id number]  Admit date - 06/24/2018   Admitting Physician Vianne Bulls, MD  Outpatient Primary MD for the patient is Hoyt Koch, MD  LOS - 0   Chief Complaint  Patient presents with  . Fall       Brief Narrative    77 y.o. female with medical history significant for dementia, anxiety, and insomnia, presenting to the emergency department for evaluation of generalized weakness and anorexia, work-up significant for mild rhabdomyolysis and UTI   Subjective:    Jeremy Johann no significant events overnight, cannot provide any reliable complaints.   Assessment  & Plan :    Principal Problem:   Rhabdomyolysis Active Problems:   Anxiety and depression   Hypokalemia   Dementia   Failure to thrive in adult   Leukocytosis   Pressure injury of skin   Rhabdomyolysis  -She presents with generalized weakness, anorexia, poor oral intake, explained that on the floor, too weak to get up even with her husband's help. -Provide any reliable complaints given her dementia -CK elevated at 2135, trending down, it is 1949 today, continue with IV fluids, as I do not think she would be able to have enough oral intake currently to maintain appropriate level of hydration in the setting of rhabdomyolysis.   UTI -Patient  is demented, cannot provide any reliable complaints, she had positive urine analysis, elevated lactic acid, and elevated white blood cell on presentation, I will start on IV Rocephin empirically, will follow urine cultures.  Hypokalemia  - Repleted, it is 3.7 today, recheck in a.m.  Dementia; failure to thrive  - Husband reports a gradual  decline with decreasing appetite and now no appetite for the past couple weeks  - Patient has been increasingly weak in general and requiring husband's assistance to get out of bed  - Husband suspects this is secondary to advancing dementia, would like to continue caring for her at home, but feels he may need help  - Continue supportive care, consult with PT  and case manager      Routine calorie malnutrition -We will start on Ensure, and consult nutrition   Code Status : DNR  Family Communication  : None at bedside  Disposition Plan  : Will need SNF placement  Barriers For Discharge :   Consults  :  none  Procedures  : none  DVT Prophylaxis  :  lovenox  Lab Results  Component Value Date   PLT 190 06/25/2018    Antibiotics  :   Anti-infectives (From admission, onward)   Start     Dose/Rate Route Frequency Ordered Stop   06/25/18 0800  cefTRIAXone (ROCEPHIN) 1 g in sodium chloride 0.9 % 100 mL IVPB     1 g 200 mL/hr over 30 Minutes Intravenous Every 24 hours 06/25/18 0703          Objective:   Vitals:   06/24/18 2345 06/25/18 0025 06/25/18 0200 06/25/18 0601  BP: (!) 137/57 (!) 143/108  (!) 91/50  Pulse: 73 75  72  Resp: 15 18  16   Temp:    97.8 F (36.6 C)  TempSrc:    Oral  SpO2: (!) 78%  98% 98%  Weight:  44.9 kg    Height:  5\' 6"  (1.676 m)      Wt Readings from Last 3 Encounters:  06/25/18 44.9 kg  03/21/18 44.9 kg  12/23/17 45.7 kg     Intake/Output Summary (Last 24 hours) at 06/25/2018 1249 Last data filed at 06/25/2018 0600 Gross per 24 hour  Intake 1032.29 ml  Output -  Net 1032.29 ml     Physical Exam  Frail, cachectic elderly female, laying in bed in no apparent distress, alert x1 .  Confused. Supple Neck,No JVD, No cervical lymphadenopathy appriciated.  Symmetrical Chest wall movement, Good air movement bilaterally, CTAB RRR,No Gallops,Rubs or new Murmurs, No Parasternal Heave +ve B.Sounds, Abd Soft, No tenderness,  No rebound -  guarding or rigidity. No Cyanosis, Clubbing or edema, No new Rash or bruise, she is with significant muscle wasting    Data Review:    CBC Recent Labs  Lab 06/24/18 2039 06/24/18 2045 06/25/18 0318  WBC 13.7*  --  13.0*  HGB 15.2* 16.3* 13.8  HCT 46.2* 48.0* 42.2  PLT 214  --  190  MCV 90.2  --  89.6  MCH 29.7  --  29.3  MCHC 32.9  --  32.7  RDW 14.6  --  14.6  LYMPHSABS  --   --  1.8  MONOABS  --   --  1.4*  EOSABS  --   --  0.0  BASOSABS  --   --  0.0    Chemistries  Recent Labs  Lab 06/24/18 2039 06/24/18 2045 06/25/18 0318  NA 145 145 142  K 2.8* 2.8* 3.7  CL 106 107 110  CO2 21*  --  23  GLUCOSE 118* 120* 179*  BUN 22 25* 16  CREATININE 0.67 0.50 0.68  CALCIUM 9.2  --  8.6*  AST 57*  --   --   ALT 32  --   --   ALKPHOS 94  --   --   BILITOT 1.9*  --   --    ------------------------------------------------------------------------------------------------------------------ No results for input(s): CHOL, HDL, LDLCALC, TRIG, CHOLHDL, LDLDIRECT in the last 72 hours.  Lab Results  Component Value Date   HGBA1C 6.0 08/07/2016   ------------------------------------------------------------------------------------------------------------------ No results for input(s): TSH, T4TOTAL, T3FREE, THYROIDAB in the last 72 hours.  Invalid  input(s): FREET3 ------------------------------------------------------------------------------------------------------------------ No results for input(s): VITAMINB12, FOLATE, FERRITIN, TIBC, IRON, RETICCTPCT in the last 72 hours.  Coagulation profile Recent Labs  Lab 06/24/18 2039  INR 1.13    No results for input(s): DDIMER in the last 72 hours.  Cardiac Enzymes Recent Labs  Lab 06/24/18 2039  CKMB 9.9*   ------------------------------------------------------------------------------------------------------------------ No results found for: BNP  Inpatient Medications  Scheduled Meds: . enoxaparin (LOVENOX)  injection  30 mg Subcutaneous Q24H  . feeding supplement (ENSURE ENLIVE)  237 mL Oral BID BM   Continuous Infusions: . 0.9 % NaCl with KCl 40 mEq / L 110 mL/hr (06/25/18 0141)  . cefTRIAXone (ROCEPHIN)  IV     PRN Meds:.acetaminophen **OR** acetaminophen, HYDROcodone-acetaminophen, ondansetron **OR** ondansetron (ZOFRAN) IV, polyvinyl alcohol, senna-docusate, zolpidem  Micro Results No results found for this or any previous visit (from the past 240 hour(s)).  Radiology Reports Ct Head Wo Contrast  Result Date: 06/24/2018 CLINICAL DATA:  77 year old female with dementia. EXAM: CT HEAD WITHOUT CONTRAST CT CERVICAL SPINE WITHOUT CONTRAST TECHNIQUE: Multidetector CT imaging of the head and cervical spine was performed following the standard protocol without intravenous contrast. Multiplanar CT image reconstructions of the cervical spine were also generated. COMPARISON:  Brain MRI dated 02/22/2017 FINDINGS: CT HEAD FINDINGS Brain: Mild to moderate age-related atrophy and chronic microvascular ischemic changes. There is no acute intracranial hemorrhage. No mass effect or midline shift. No extra-axial fluid collection. Vascular: No hyperdense vessel or unexpected calcification. Skull: Normal. Negative for fracture or focal lesion. Sinuses/Orbits: No acute finding. Other: None CT CERVICAL SPINE FINDINGS Alignment: No acute subluxation. Skull base and vertebrae: No acute fracture. No primary bone lesion or focal pathologic process. Soft tissues and spinal canal: No prevertebral fluid or swelling. No visible canal hematoma. Disc levels:  Mild degenerative changes. Upper chest: Biapical subpleural scarring. Other: None IMPRESSION: 1. No acute intracranial hemorrhage. 2. Age-related atrophy and chronic microvascular ischemic changes. 3. No acute/traumatic cervical spine pathology. Electronically Signed   By: Anner Crete M.D.   On: 06/24/2018 22:04   Ct Chest W Contrast  Result Date: 06/24/2018 CLINICAL  DATA:  Blunt trauma. History of dementia. Patient found on the floor by family this evening. EXAM: CT CHEST, ABDOMEN, AND PELVIS WITH CONTRAST TECHNIQUE: Multidetector CT imaging of the chest, abdomen and pelvis was performed following the standard protocol during bolus administration of intravenous contrast. CONTRAST:  125mL OMNIPAQUE IOHEXOL 300 MG/ML  SOLN COMPARISON:  12/05/2017 chest CT, CT abdomen and pelvis 12/05/2017 FINDINGS: CT CHEST FINDINGS CARDIOVASCULAR: Heart size is normal. No pericardial effusion. Nonaneurysmal atherosclerotic thoracic aorta. No mediastinal hematoma. No large central pulmonary embolus. Great vessels are unremarkable and patent. MEDIASTINUM/NODES: Unremarkable thyroid gland. No lymphadenopathy by CT size criteria. Patent trachea and mainstem bronchi. CT appearance of the esophagus is unremarkable. No hilar lymphadenopathy is noted. Midline patent trachea and mainstem bronchi. LUNGS/PLEURA: No acute pulmonary consolidation, effusion or pneumothorax. No dominant mass is seen. Scarring at the apices with right upper lobe bronchiectasis and dystrophic calcifications. Lesser degree of apical pleuroparenchymal scarring with calcifications on the left. No pulmonary contusion, effusion or pneumothorax. MUSCULOSKELETAL: Calcified subglandular bilateral breast implants. No acute osseous appearing abnormality is noted. CT ABDOMEN AND PELVIS FINDINGS HEPATIC BILIARY: Steatosis of the liver. No liver laceration or subcapsular fluid. Physiologic distention of the gallbladder without stones. No biliary dilatation is identified. PANCREAS: No pancreatic mass, inflammation or ductal dilatation. SPLEEN: Normal size spleen without mass. ADRENALS/URINARY TRACT: Normal bilateral adrenal glands and kidneys. No enhancing mass lesion  of either kidney. Stable 8 mm cortical cyst in the lower pole the right kidney. No nephrolithiasis nor obstructive uropathy. The ureters are not dilated. No  hydroureteronephrosis is noted. Distended urinary bladder without focal mural thickening or calculus. STOMACH/BOWEL: The stomach, small and large bowel are normal in course and caliber without mural thickening or inflammation. No mechanical bowel obstruction is noted. Appendectomy. VASCULAR/LYMPHATIC: Nonaneurysmal thoracic aorta. No lymphadenopathy by CT size criteria. REPRODUCTIVE: Hysterectomy.  No adnexal mass. OTHER: No free air nor free fluid. MUSCULOSKELETAL: Mild dextroconvex curvature of the lumbar spine. No acute nor suspicious osseous lesions. IMPRESSION: 1. No acute chest, abdomen or pelvic abnormality. 2. Chronic stable pleuroparenchymal changes and scarring at the apices of both lungs, right middle lobe and lingula. 3. No acute solid nor hollow visceral organ injury. 4. Mild hepatic steatosis. Electronically Signed   By: Ashley Royalty M.D.   On: 06/24/2018 22:08   Ct Cervical Spine Wo Contrast  Result Date: 06/24/2018 CLINICAL DATA:  77 year old female with dementia. EXAM: CT HEAD WITHOUT CONTRAST CT CERVICAL SPINE WITHOUT CONTRAST TECHNIQUE: Multidetector CT imaging of the head and cervical spine was performed following the standard protocol without intravenous contrast. Multiplanar CT image reconstructions of the cervical spine were also generated. COMPARISON:  Brain MRI dated 02/22/2017 FINDINGS: CT HEAD FINDINGS Brain: Mild to moderate age-related atrophy and chronic microvascular ischemic changes. There is no acute intracranial hemorrhage. No mass effect or midline shift. No extra-axial fluid collection. Vascular: No hyperdense vessel or unexpected calcification. Skull: Normal. Negative for fracture or focal lesion. Sinuses/Orbits: No acute finding. Other: None CT CERVICAL SPINE FINDINGS Alignment: No acute subluxation. Skull base and vertebrae: No acute fracture. No primary bone lesion or focal pathologic process. Soft tissues and spinal canal: No prevertebral fluid or swelling. No visible  canal hematoma. Disc levels:  Mild degenerative changes. Upper chest: Biapical subpleural scarring. Other: None IMPRESSION: 1. No acute intracranial hemorrhage. 2. Age-related atrophy and chronic microvascular ischemic changes. 3. No acute/traumatic cervical spine pathology. Electronically Signed   By: Anner Crete M.D.   On: 06/24/2018 22:04   Ct Abdomen Pelvis W Contrast  Result Date: 06/24/2018 CLINICAL DATA:  Blunt trauma. History of dementia. Patient found on the floor by family this evening. EXAM: CT CHEST, ABDOMEN, AND PELVIS WITH CONTRAST TECHNIQUE: Multidetector CT imaging of the chest, abdomen and pelvis was performed following the standard protocol during bolus administration of intravenous contrast. CONTRAST:  132mL OMNIPAQUE IOHEXOL 300 MG/ML  SOLN COMPARISON:  12/05/2017 chest CT, CT abdomen and pelvis 12/05/2017 FINDINGS: CT CHEST FINDINGS CARDIOVASCULAR: Heart size is normal. No pericardial effusion. Nonaneurysmal atherosclerotic thoracic aorta. No mediastinal hematoma. No large central pulmonary embolus. Great vessels are unremarkable and patent. MEDIASTINUM/NODES: Unremarkable thyroid gland. No lymphadenopathy by CT size criteria. Patent trachea and mainstem bronchi. CT appearance of the esophagus is unremarkable. No hilar lymphadenopathy is noted. Midline patent trachea and mainstem bronchi. LUNGS/PLEURA: No acute pulmonary consolidation, effusion or pneumothorax. No dominant mass is seen. Scarring at the apices with right upper lobe bronchiectasis and dystrophic calcifications. Lesser degree of apical pleuroparenchymal scarring with calcifications on the left. No pulmonary contusion, effusion or pneumothorax. MUSCULOSKELETAL: Calcified subglandular bilateral breast implants. No acute osseous appearing abnormality is noted. CT ABDOMEN AND PELVIS FINDINGS HEPATIC BILIARY: Steatosis of the liver. No liver laceration or subcapsular fluid. Physiologic distention of the gallbladder without  stones. No biliary dilatation is identified. PANCREAS: No pancreatic mass, inflammation or ductal dilatation. SPLEEN: Normal size spleen without mass. ADRENALS/URINARY TRACT: Normal bilateral adrenal  glands and kidneys. No enhancing mass lesion of either kidney. Stable 8 mm cortical cyst in the lower pole the right kidney. No nephrolithiasis nor obstructive uropathy. The ureters are not dilated. No hydroureteronephrosis is noted. Distended urinary bladder without focal mural thickening or calculus. STOMACH/BOWEL: The stomach, small and large bowel are normal in course and caliber without mural thickening or inflammation. No mechanical bowel obstruction is noted. Appendectomy. VASCULAR/LYMPHATIC: Nonaneurysmal thoracic aorta. No lymphadenopathy by CT size criteria. REPRODUCTIVE: Hysterectomy.  No adnexal mass. OTHER: No free air nor free fluid. MUSCULOSKELETAL: Mild dextroconvex curvature of the lumbar spine. No acute nor suspicious osseous lesions. IMPRESSION: 1. No acute chest, abdomen or pelvic abnormality. 2. Chronic stable pleuroparenchymal changes and scarring at the apices of both lungs, right middle lobe and lingula. 3. No acute solid nor hollow visceral organ injury. 4. Mild hepatic steatosis. Electronically Signed   By: Ashley Royalty M.D.   On: 06/24/2018 22:08   Dg Pelvis Portable  Result Date: 06/24/2018 CLINICAL DATA:  Unwitnessed fall. EXAM: PORTABLE PELVIS 1-2 VIEWS COMPARISON:  Radiographs of May 30, 2008. FINDINGS: There is no evidence of pelvic fracture or diastasis. No pelvic bone lesions are seen. IMPRESSION: Negative. Electronically Signed   By: Marijo Conception, M.D.   On: 06/24/2018 20:54   Ct T-spine No Charge  Result Date: 06/24/2018 CLINICAL DATA:  Patient found down on floor by family. Blunt trauma. EXAM: CT THORACIC SPINE WITHOUT CONTRAST TECHNIQUE: Multidetector CT images of the thoracic were obtained using the standard protocol without intravenous contrast. COMPARISON:  None.  FINDINGS: Alignment: Normal. Vertebrae: No acute fracture or focal pathologic process. Twelve paired thoracic ribs without acute fracture of the visualized portions. Paraspinal and other soft tissues: Visible canal hematoma. No suspicious osseous lesions. No paraspinal soft tissue mass or hematoma. Disc levels: No focal disc herniation or significant foraminal or canal stenosis. IMPRESSION: No acute osseous abnormality of the thoracic spine. Electronically Signed   By: Ashley Royalty M.D.   On: 06/24/2018 22:12   Ct L-spine No Charge  Result Date: 06/24/2018 CLINICAL DATA:  Patient found on floor by family.  Lower back pain. EXAM: CT LUMBAR SPINE WITHOUT CONTRAST TECHNIQUE: Multidetector CT imaging of the lumbar spine was performed without intravenous contrast administration. Multiplanar CT image reconstructions were also generated. COMPARISON:  None. FINDINGS: Segmentation: Standard Alignment: Maintained lumbar lordosis. No pars defects or listhesis. Vertebrae: No acute fracture or focal pathologic process. Paraspinal and other soft tissues: Negative. Disc levels: No significant central or foraminal stenosis. Slight disc space narrowing and vacuum phenomenon at L1-2 and L2-3. IMPRESSION: No acute lumbar spine fracture or listhesis. 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 Electronically Signed   By: Ashley Royalty M.D.   On: 06/24/2018 22:14   Dg Chest Port 1 View  Result Date: 06/24/2018 CLINICAL DATA:  Fall. EXAM: PORTABLE CHEST 1 VIEW COMPARISON:  Radiographs and CT scan of December 05, 2017. FINDINGS: The heart size and mediastinal contours are within normal limits. Both lungs are clear. No pneumothorax or pleural effusion is noted. The visualized skeletal structures are unremarkable. IMPRESSION: No acute cardiopulmonary abnormality seen. Electronically Signed   By: Marijo Conception, M.D.   On: 06/24/2018 20:53      Phillips Climes M.D on 06/25/2018 at 12:49 PM  Between 7am to 7pm - Pager -  [phone]  After 7pm go to [email or web address] - password [email or web address]  [facility] -  Office  [phone]

## 2018-06-25 NOTE — Progress Notes (Signed)
CSW received consult regarding PT recommendation of SNF at discharge.  CSW spoke with patient's spouse. He reports that patient has dementia at baseline and often does not know him. He does not feel patient would benefit from rehab due to not being able to follow commands. CSW offered to see if insurance will approve her to stay at SNF for nursing needs, but patient's spouse states that he would prefer to bring her back home with home health. He has never used home health before so does not have a preference of which company. He asked if there was a copay. RNCM aware and will set services up.   CSW signing off.   Percell Locus Murrell Dome LCSW [phone]

## 2018-06-26 ENCOUNTER — Other Ambulatory Visit: Payer: Self-pay

## 2018-06-26 DIAGNOSIS — T796XXD Traumatic ischemia of muscle, subsequent encounter: Secondary | ICD-10-CM

## 2018-06-26 DIAGNOSIS — N390 Urinary tract infection, site not specified: Secondary | ICD-10-CM

## 2018-06-26 LAB — BASIC METABOLIC PANEL
ANION GAP: 8 (ref 5–15)
BUN: 16 mg/dL (ref 8–23)
CO2: 22 mmol/L (ref 22–32)
Calcium: 8.2 mg/dL — ABNORMAL LOW (ref 8.9–10.3)
Chloride: 110 mmol/L (ref 98–111)
Creatinine, Ser: 0.38 mg/dL — ABNORMAL LOW (ref 0.44–1.00)
Glucose, Bld: 105 mg/dL — ABNORMAL HIGH (ref 70–99)
Potassium: 3.3 mmol/L — ABNORMAL LOW (ref 3.5–5.1)
Sodium: 140 mmol/L (ref 135–145)

## 2018-06-26 LAB — CBC
HCT: 37.7 % (ref 36.0–46.0)
HEMOGLOBIN: 12.2 g/dL (ref 12.0–15.0)
MCH: 29 pg (ref 26.0–34.0)
MCHC: 32.4 g/dL (ref 30.0–36.0)
MCV: 89.8 fL (ref 78.0–100.0)
Platelets: 168 10*3/uL (ref 150–400)
RBC: 4.2 MIL/uL (ref 3.87–5.11)
RDW: 14.6 % (ref 11.5–15.5)
WBC: 10.4 10*3/uL (ref 4.0–10.5)

## 2018-06-26 LAB — CK: CK TOTAL: 1130 U/L — AB (ref 38–234)

## 2018-06-26 LAB — PHOSPHORUS: Phosphorus: 2.5 mg/dL (ref 2.5–4.6)

## 2018-06-26 LAB — GLUCOSE, CAPILLARY: GLUCOSE-CAPILLARY: 84 mg/dL (ref 70–99)

## 2018-06-26 MED ORDER — POTASSIUM CHLORIDE CRYS ER 20 MEQ PO TBCR
40.0000 meq | EXTENDED_RELEASE_TABLET | Freq: Once | ORAL | Status: AC
  Administered 2018-06-26: 40 meq via ORAL
  Filled 2018-06-26: qty 2

## 2018-06-26 MED ORDER — K PHOS MONO-SOD PHOS DI & MONO 155-852-130 MG PO TABS
500.0000 mg | ORAL_TABLET | Freq: Three times a day (TID) | ORAL | Status: DC
  Administered 2018-06-26 – 2018-06-27 (×4): 500 mg via ORAL
  Filled 2018-06-26 (×5): qty 2

## 2018-06-26 MED ORDER — HALOPERIDOL LACTATE 5 MG/ML IJ SOLN
5.0000 mg | Freq: Once | INTRAMUSCULAR | Status: AC
  Administered 2018-06-26: 5 mg via INTRAVENOUS
  Filled 2018-06-26: qty 1

## 2018-06-26 NOTE — Progress Notes (Signed)
PROGRESS NOTE                                                                                                                                                                                                             Patient Demographics:    Gwendolyn Bright, is a 77 y.o. female, DOB - 1941-06-08, QIH:[id number]  Admit date - 06/24/2018   Admitting Physician Vianne Bulls, MD  Outpatient Primary MD for the patient is Hoyt Koch, MD  LOS - 1   Chief Complaint  Patient presents with  . Fall       Brief Narrative    77 y.o. female with medical history significant for dementia, anxiety, and insomnia, presenting to the emergency department for evaluation of generalized weakness and anorexia, work-up significant for mild rhabdomyolysis and UTI   Subjective:    Jeremy Johann no significant events overnight, denies any chest pain, shortness of breath or cough   Assessment  & Plan :    Principal Problem:   Rhabdomyolysis Active Problems:   Anxiety and depression   Hypokalemia   Dementia   Failure to thrive in adult   Leukocytosis   Pressure injury of skin   Rhabdomyolysis  -She presents with generalized weakness, anorexia, poor oral intake, explained that on the floor, too weak to get up even with her husband's help. -CK elevated at 2135, continues to trend down with gentle hydration, it is 1130 today, continue with IV fluids, and her appetite and oral intake has improved as well.   UTI -Patient  is demented, cannot provide any reliable complaints, she had positive urine analysis, elevated lactic acid, and elevated white blood cell on presentation, continue with IV Rocephin, follow urine cultures .  Hypokalemia  -3.3 today, will replete, her phosphorus is borderline at 2.5, I will start on Neutra-Phos for the next 24 hours as some risk for refeeding syndrome  Dementia; failure to thrive  - Husband reports a gradual decline with  decreasing appetite and now no appetite for the past couple weeks  - Patient has been increasingly weak in general and requiring husband's assistance to get out of bed  - Husband suspects this is secondary to advancing dementia, would like to continue caring for her at home, but feels he may need help  - Continue supportive care, PT consulted, recommendation  for SNF, but husband wants to take patient home, so will provide some home care   Routine calorie malnutrition -Is on Ensure   Code Status : DNR  Family Communication  : None at bedside  Disposition Plan  : Home with home care in 24 hours Barriers For Discharge :   Consults  :  none  Procedures  : none  DVT Prophylaxis  :  lovenox  Lab Results  Component Value Date   PLT 168 06/26/2018    Antibiotics  :   Anti-infectives (From admission, onward)   Start     Dose/Rate Route Frequency Ordered Stop   06/25/18 0800  cefTRIAXone (ROCEPHIN) 1 g in sodium chloride 0.9 % 100 mL IVPB     1 g 200 mL/hr over 30 Minutes Intravenous Every 24 hours 06/25/18 0703          Objective:   Vitals:   06/25/18 1426 06/25/18 2219 06/26/18 0533 06/26/18 0918  BP: 114/63 110/68 110/67 119/71  Pulse: (!) 55 63 97 63  Resp: 18 17 17 16   Temp:  98.6 F (37 C) 97.8 F (36.6 C) 97.7 F (36.5 C)  TempSrc:  Oral    SpO2: 100% 98% 100% 100%  Weight:      Height:        Wt Readings from Last 3 Encounters:  06/25/18 44.9 kg  03/21/18 44.9 kg  12/23/17 45.7 kg     Intake/Output Summary (Last 24 hours) at 06/26/2018 1149 Last data filed at 06/25/2018 1700 Gross per 24 hour  Intake 100 ml  Output 600 ml  Net -500 ml     Physical Exam  Frail, cachectic, elderly female, laying in bed in no apparent distress, more conversant today, but remains with significant confusion  Good air entry bilaterally, no wheezing or rales  Regular rate and rhythm, no rubs or gallops  Soft, nontender, nondistended  Extremity with no edema, clubbing  or cyanosis, has significant muscle wasting .   Data Review:    CBC Recent Labs  Lab 06/24/18 2039 06/24/18 2045 06/25/18 0318 06/26/18 0446  WBC 13.7*  --  13.0* 10.4  HGB 15.2* 16.3* 13.8 12.2  HCT 46.2* 48.0* 42.2 37.7  PLT 214  --  190 168  MCV 90.2  --  89.6 89.8  MCH 29.7  --  29.3 29.0  MCHC 32.9  --  32.7 32.4  RDW 14.6  --  14.6 14.6  LYMPHSABS  --   --  1.8  --   MONOABS  --   --  1.4*  --   EOSABS  --   --  0.0  --   BASOSABS  --   --  0.0  --     Chemistries  Recent Labs  Lab 06/24/18 2039 06/24/18 2045 06/25/18 0318 06/26/18 0446  NA 145 145 142 140  K 2.8* 2.8* 3.7 3.3*  CL 106 107 110 110  CO2 21*  --  23 22  GLUCOSE 118* 120* 179* 105*  BUN 22 25* 16 16  CREATININE 0.67 0.50 0.68 0.38*  CALCIUM 9.2  --  8.6* 8.2*  AST 57*  --   --   --   ALT 32  --   --   --   ALKPHOS 94  --   --   --   BILITOT 1.9*  --   --   --    ------------------------------------------------------------------------------------------------------------------ No results for input(s): CHOL, HDL, LDLCALC, TRIG, CHOLHDL, LDLDIRECT in the  last 72 hours.  Lab Results  Component Value Date   HGBA1C 6.0 08/07/2016   ------------------------------------------------------------------------------------------------------------------ No results for input(s): TSH, T4TOTAL, T3FREE, THYROIDAB in the last 72 hours.  Invalid input(s): FREET3 ------------------------------------------------------------------------------------------------------------------ No results for input(s): VITAMINB12, FOLATE, FERRITIN, TIBC, IRON, RETICCTPCT in the last 72 hours.  Coagulation profile Recent Labs  Lab 06/24/18 2039  INR 1.13    No results for input(s): DDIMER in the last 72 hours.  Cardiac Enzymes Recent Labs  Lab 06/24/18 2039  CKMB 9.9*   ------------------------------------------------------------------------------------------------------------------ No results found for:  BNP  Inpatient Medications  Scheduled Meds: . enoxaparin (LOVENOX) injection  30 mg Subcutaneous Q24H  . feeding supplement (ENSURE ENLIVE)  237 mL Oral BID BM  . phosphorus  500 mg Oral TID   Continuous Infusions: . cefTRIAXone (ROCEPHIN)  IV 1 g (06/26/18 0952)   PRN Meds:.acetaminophen **OR** acetaminophen, HYDROcodone-acetaminophen, ondansetron **OR** ondansetron (ZOFRAN) IV, polyvinyl alcohol, senna-docusate, zolpidem  Micro Results No results found for this or any previous visit (from the past 240 hour(s)).  Radiology Reports Ct Head Wo Contrast  Result Date: 06/24/2018 CLINICAL DATA:  77 year old female with dementia. EXAM: CT HEAD WITHOUT CONTRAST CT CERVICAL SPINE WITHOUT CONTRAST TECHNIQUE: Multidetector CT imaging of the head and cervical spine was performed following the standard protocol without intravenous contrast. Multiplanar CT image reconstructions of the cervical spine were also generated. COMPARISON:  Brain MRI dated 02/22/2017 FINDINGS: CT HEAD FINDINGS Brain: Mild to moderate age-related atrophy and chronic microvascular ischemic changes. There is no acute intracranial hemorrhage. No mass effect or midline shift. No extra-axial fluid collection. Vascular: No hyperdense vessel or unexpected calcification. Skull: Normal. Negative for fracture or focal lesion. Sinuses/Orbits: No acute finding. Other: None CT CERVICAL SPINE FINDINGS Alignment: No acute subluxation. Skull base and vertebrae: No acute fracture. No primary bone lesion or focal pathologic process. Soft tissues and spinal canal: No prevertebral fluid or swelling. No visible canal hematoma. Disc levels:  Mild degenerative changes. Upper chest: Biapical subpleural scarring. Other: None IMPRESSION: 1. No acute intracranial hemorrhage. 2. Age-related atrophy and chronic microvascular ischemic changes. 3. No acute/traumatic cervical spine pathology. Electronically Signed   By: Anner Crete M.D.   On: 06/24/2018 22:04    Ct Chest W Contrast  Result Date: 06/24/2018 CLINICAL DATA:  Blunt trauma. History of dementia. Patient found on the floor by family this evening. EXAM: CT CHEST, ABDOMEN, AND PELVIS WITH CONTRAST TECHNIQUE: Multidetector CT imaging of the chest, abdomen and pelvis was performed following the standard protocol during bolus administration of intravenous contrast. CONTRAST:  165mL OMNIPAQUE IOHEXOL 300 MG/ML  SOLN COMPARISON:  12/05/2017 chest CT, CT abdomen and pelvis 12/05/2017 FINDINGS: CT CHEST FINDINGS CARDIOVASCULAR: Heart size is normal. No pericardial effusion. Nonaneurysmal atherosclerotic thoracic aorta. No mediastinal hematoma. No large central pulmonary embolus. Great vessels are unremarkable and patent. MEDIASTINUM/NODES: Unremarkable thyroid gland. No lymphadenopathy by CT size criteria. Patent trachea and mainstem bronchi. CT appearance of the esophagus is unremarkable. No hilar lymphadenopathy is noted. Midline patent trachea and mainstem bronchi. LUNGS/PLEURA: No acute pulmonary consolidation, effusion or pneumothorax. No dominant mass is seen. Scarring at the apices with right upper lobe bronchiectasis and dystrophic calcifications. Lesser degree of apical pleuroparenchymal scarring with calcifications on the left. No pulmonary contusion, effusion or pneumothorax. MUSCULOSKELETAL: Calcified subglandular bilateral breast implants. No acute osseous appearing abnormality is noted. CT ABDOMEN AND PELVIS FINDINGS HEPATIC BILIARY: Steatosis of the liver. No liver laceration or subcapsular fluid. Physiologic distention of the gallbladder without stones. No biliary dilatation  is identified. PANCREAS: No pancreatic mass, inflammation or ductal dilatation. SPLEEN: Normal size spleen without mass. ADRENALS/URINARY TRACT: Normal bilateral adrenal glands and kidneys. No enhancing mass lesion of either kidney. Stable 8 mm cortical cyst in the lower pole the right kidney. No nephrolithiasis nor obstructive  uropathy. The ureters are not dilated. No hydroureteronephrosis is noted. Distended urinary bladder without focal mural thickening or calculus. STOMACH/BOWEL: The stomach, small and large bowel are normal in course and caliber without mural thickening or inflammation. No mechanical bowel obstruction is noted. Appendectomy. VASCULAR/LYMPHATIC: Nonaneurysmal thoracic aorta. No lymphadenopathy by CT size criteria. REPRODUCTIVE: Hysterectomy.  No adnexal mass. OTHER: No free air nor free fluid. MUSCULOSKELETAL: Mild dextroconvex curvature of the lumbar spine. No acute nor suspicious osseous lesions. IMPRESSION: 1. No acute chest, abdomen or pelvic abnormality. 2. Chronic stable pleuroparenchymal changes and scarring at the apices of both lungs, right middle lobe and lingula. 3. No acute solid nor hollow visceral organ injury. 4. Mild hepatic steatosis. Electronically Signed   By: Ashley Royalty M.D.   On: 06/24/2018 22:08   Ct Cervical Spine Wo Contrast  Result Date: 06/24/2018 CLINICAL DATA:  77 year old female with dementia. EXAM: CT HEAD WITHOUT CONTRAST CT CERVICAL SPINE WITHOUT CONTRAST TECHNIQUE: Multidetector CT imaging of the head and cervical spine was performed following the standard protocol without intravenous contrast. Multiplanar CT image reconstructions of the cervical spine were also generated. COMPARISON:  Brain MRI dated 02/22/2017 FINDINGS: CT HEAD FINDINGS Brain: Mild to moderate age-related atrophy and chronic microvascular ischemic changes. There is no acute intracranial hemorrhage. No mass effect or midline shift. No extra-axial fluid collection. Vascular: No hyperdense vessel or unexpected calcification. Skull: Normal. Negative for fracture or focal lesion. Sinuses/Orbits: No acute finding. Other: None CT CERVICAL SPINE FINDINGS Alignment: No acute subluxation. Skull base and vertebrae: No acute fracture. No primary bone lesion or focal pathologic process. Soft tissues and spinal canal: No  prevertebral fluid or swelling. No visible canal hematoma. Disc levels:  Mild degenerative changes. Upper chest: Biapical subpleural scarring. Other: None IMPRESSION: 1. No acute intracranial hemorrhage. 2. Age-related atrophy and chronic microvascular ischemic changes. 3. No acute/traumatic cervical spine pathology. Electronically Signed   By: Anner Crete M.D.   On: 06/24/2018 22:04   Ct Abdomen Pelvis W Contrast  Result Date: 06/24/2018 CLINICAL DATA:  Blunt trauma. History of dementia. Patient found on the floor by family this evening. EXAM: CT CHEST, ABDOMEN, AND PELVIS WITH CONTRAST TECHNIQUE: Multidetector CT imaging of the chest, abdomen and pelvis was performed following the standard protocol during bolus administration of intravenous contrast. CONTRAST:  126mL OMNIPAQUE IOHEXOL 300 MG/ML  SOLN COMPARISON:  12/05/2017 chest CT, CT abdomen and pelvis 12/05/2017 FINDINGS: CT CHEST FINDINGS CARDIOVASCULAR: Heart size is normal. No pericardial effusion. Nonaneurysmal atherosclerotic thoracic aorta. No mediastinal hematoma. No large central pulmonary embolus. Great vessels are unremarkable and patent. MEDIASTINUM/NODES: Unremarkable thyroid gland. No lymphadenopathy by CT size criteria. Patent trachea and mainstem bronchi. CT appearance of the esophagus is unremarkable. No hilar lymphadenopathy is noted. Midline patent trachea and mainstem bronchi. LUNGS/PLEURA: No acute pulmonary consolidation, effusion or pneumothorax. No dominant mass is seen. Scarring at the apices with right upper lobe bronchiectasis and dystrophic calcifications. Lesser degree of apical pleuroparenchymal scarring with calcifications on the left. No pulmonary contusion, effusion or pneumothorax. MUSCULOSKELETAL: Calcified subglandular bilateral breast implants. No acute osseous appearing abnormality is noted. CT ABDOMEN AND PELVIS FINDINGS HEPATIC BILIARY: Steatosis of the liver. No liver laceration or subcapsular fluid. Physiologic  distention of  the gallbladder without stones. No biliary dilatation is identified. PANCREAS: No pancreatic mass, inflammation or ductal dilatation. SPLEEN: Normal size spleen without mass. ADRENALS/URINARY TRACT: Normal bilateral adrenal glands and kidneys. No enhancing mass lesion of either kidney. Stable 8 mm cortical cyst in the lower pole the right kidney. No nephrolithiasis nor obstructive uropathy. The ureters are not dilated. No hydroureteronephrosis is noted. Distended urinary bladder without focal mural thickening or calculus. STOMACH/BOWEL: The stomach, small and large bowel are normal in course and caliber without mural thickening or inflammation. No mechanical bowel obstruction is noted. Appendectomy. VASCULAR/LYMPHATIC: Nonaneurysmal thoracic aorta. No lymphadenopathy by CT size criteria. REPRODUCTIVE: Hysterectomy.  No adnexal mass. OTHER: No free air nor free fluid. MUSCULOSKELETAL: Mild dextroconvex curvature of the lumbar spine. No acute nor suspicious osseous lesions. IMPRESSION: 1. No acute chest, abdomen or pelvic abnormality. 2. Chronic stable pleuroparenchymal changes and scarring at the apices of both lungs, right middle lobe and lingula. 3. No acute solid nor hollow visceral organ injury. 4. Mild hepatic steatosis. Electronically Signed   By: Ashley Royalty M.D.   On: 06/24/2018 22:08   Dg Pelvis Portable  Result Date: 06/24/2018 CLINICAL DATA:  Unwitnessed fall. EXAM: PORTABLE PELVIS 1-2 VIEWS COMPARISON:  Radiographs of May 30, 2008. FINDINGS: There is no evidence of pelvic fracture or diastasis. No pelvic bone lesions are seen. IMPRESSION: Negative. Electronically Signed   By: Marijo Conception, M.D.   On: 06/24/2018 20:54   Ct T-spine No Charge  Result Date: 06/24/2018 CLINICAL DATA:  Patient found down on floor by family. Blunt trauma. EXAM: CT THORACIC SPINE WITHOUT CONTRAST TECHNIQUE: Multidetector CT images of the thoracic were obtained using the standard protocol without  intravenous contrast. COMPARISON:  None. FINDINGS: Alignment: Normal. Vertebrae: No acute fracture or focal pathologic process. Twelve paired thoracic ribs without acute fracture of the visualized portions. Paraspinal and other soft tissues: Visible canal hematoma. No suspicious osseous lesions. No paraspinal soft tissue mass or hematoma. Disc levels: No focal disc herniation or significant foraminal or canal stenosis. IMPRESSION: No acute osseous abnormality of the thoracic spine. Electronically Signed   By: Ashley Royalty M.D.   On: 06/24/2018 22:12   Ct L-spine No Charge  Result Date: 06/24/2018 CLINICAL DATA:  Patient found on floor by family.  Lower back pain. EXAM: CT LUMBAR SPINE WITHOUT CONTRAST TECHNIQUE: Multidetector CT imaging of the lumbar spine was performed without intravenous contrast administration. Multiplanar CT image reconstructions were also generated. COMPARISON:  None. FINDINGS: Segmentation: Standard Alignment: Maintained lumbar lordosis. No pars defects or listhesis. Vertebrae: No acute fracture or focal pathologic process. Paraspinal and other soft tissues: Negative. Disc levels: No significant central or foraminal stenosis. Slight disc space narrowing and vacuum phenomenon at L1-2 and L2-3. IMPRESSION: No acute lumbar spine fracture or listhesis. 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 Electronically Signed   By: Ashley Royalty M.D.   On: 06/24/2018 22:14   Dg Chest Port 1 View  Result Date: 06/24/2018 CLINICAL DATA:  Fall. EXAM: PORTABLE CHEST 1 VIEW COMPARISON:  Radiographs and CT scan of December 05, 2017. FINDINGS: The heart size and mediastinal contours are within normal limits. Both lungs are clear. No pneumothorax or pleural effusion is noted. The visualized skeletal structures are unremarkable. IMPRESSION: No acute cardiopulmonary abnormality seen. Electronically Signed   By: Marijo Conception, M.D.   On: 06/24/2018 20:53      Phillips Climes M.D on 06/26/2018 at 11:49  AM  Between 7am to 7pm - Pager -  [phone]  After 7pm go to [email or web address] - password [facility]  [facility] -  Office  [phone]

## 2018-06-26 NOTE — Progress Notes (Addendum)
Initial Nutrition Assessment  DOCUMENTATION CODES:   Underweight, Severe malnutrition in context of chronic illness  INTERVENTION:   - Ensure Enlive po BID, each supplement provides 350 kcal and 20 grams of protein  Monitor magnesium, potassium, and phosphorus daily for at least 3 days, MD to replete as needed, as pt is at risk for refeeding syndrome given severe protein-calorie malnutrition.  NUTRITION DIAGNOSIS:   Severe Malnutrition related to chronic illness (dementia) as evidenced by moderate fat depletion, severe fat depletion, moderate muscle depletion, severe muscle depletion.  GOAL:   Patient will meet greater than or equal to 90% of their needs  MONITOR:   PO intake, Supplement acceptance, Labs, Weight trends, Skin  REASON FOR ASSESSMENT:   Consult Assessment of nutrition requirement/status  ASSESSMENT:   77 year old female who presented to the ED after being found on the floor by her family. PMH significant for dementia, anxiety, and severe protein-calorie malnutrition. Pt admitted with rhabdomyolysis and UTI.  Spoke with pt at bedside. Pt was pleasant but poor historian given dementia.  Pt states that she has a good appetite and eats 1-2 meals a day ("whatever they give me"). Noted pt lives at home with her husband. Unfortunately no family present at bedside to provide more detailed diet and weight history.  Pt endorses losing weight but is unsure how much or timeframe. Per weight history in chart, pt's weight has been stable between 99-102 lbs since March 2019.  Pt shares that she likes Coke and sweet tea and drinks these beverages at home.  Medications reviewed and include: Ensure Enlive BID, 500 mg K phos TID, IV antibiotics  Labs reviewed: potassium 3.3 (L) - being replaced, creatinine 0.38 (L), phosphorus 2.5 (WNL)  NUTRITION - FOCUSED PHYSICAL EXAM:    Most Recent Value  Orbital Region  Severe depletion  Upper Arm Region  Severe depletion  Thoracic  and Lumbar Region  Moderate depletion  Buccal Region  Moderate depletion  Temple Region  Severe depletion  Clavicle Bone Region  Severe depletion  Clavicle and Acromion Bone Region  Severe depletion  Scapular Bone Region  Unable to assess  Dorsal Hand  Moderate depletion  Patellar Region  Severe depletion  Anterior Thigh Region  Severe depletion  Posterior Calf Region  Severe depletion  Edema (RD Assessment)  None  Hair  Reviewed  Eyes  Reviewed  Mouth  Reviewed  Skin  Reviewed  Nails  Reviewed       Diet Order:   Diet Order            Diet Heart Room service appropriate? Yes; Fluid consistency: Thin  Diet effective now              EDUCATION NEEDS:   Not appropriate for education at this time  Skin:  Skin Assessment: Skin Integrity Issues: Stage I: sacrum  Last BM:  unknown/PTA  Height:   Ht Readings from Last 1 Encounters:  06/25/18 5\' 6"  (1.676 m)    Weight:   Wt Readings from Last 1 Encounters:  06/25/18 44.9 kg    Ideal Body Weight:  59.09 kg  BMI:  Body mass index is 15.98 kg/m.  Estimated Nutritional Needs:   Kcal:  1350-1550  Protein:  65-80 grams  Fluid:  1.4-1.6 L    Gaynell Face, MS, RD, LDN Inpatient Clinical Dietitian Pager: [phone] Weekend/After Hours: [phone]

## 2018-06-27 ENCOUNTER — Telehealth: Payer: Self-pay | Admitting: *Deleted

## 2018-06-27 LAB — GLUCOSE, CAPILLARY: GLUCOSE-CAPILLARY: 101 mg/dL — AB (ref 70–99)

## 2018-06-27 LAB — BASIC METABOLIC PANEL
ANION GAP: 10 (ref 5–15)
BUN: 12 mg/dL (ref 8–23)
CALCIUM: 8.4 mg/dL — AB (ref 8.9–10.3)
CO2: 25 mmol/L (ref 22–32)
CREATININE: 0.5 mg/dL (ref 0.44–1.00)
Chloride: 106 mmol/L (ref 98–111)
Glucose, Bld: 103 mg/dL — ABNORMAL HIGH (ref 70–99)
Potassium: 3.5 mmol/L (ref 3.5–5.1)
Sodium: 141 mmol/L (ref 135–145)

## 2018-06-27 LAB — URINE CULTURE: CULTURE: NO GROWTH

## 2018-06-27 LAB — CK: Total CK: 759 U/L — ABNORMAL HIGH (ref 38–234)

## 2018-06-27 MED ORDER — POTASSIUM CHLORIDE 20 MEQ PO PACK
40.0000 meq | PACK | Freq: Once | ORAL | Status: AC
  Administered 2018-06-27: 40 meq via ORAL
  Filled 2018-06-27: qty 2

## 2018-06-27 MED ORDER — ACETAMINOPHEN 325 MG PO TABS: 650.0000 mg | ORAL_TABLET | Freq: Four times a day (QID) | ORAL | Status: AC | PRN

## 2018-06-27 MED ORDER — POTASSIUM CHLORIDE CRYS ER 20 MEQ PO TBCR
40.0000 meq | EXTENDED_RELEASE_TABLET | Freq: Once | ORAL | Status: DC
  Filled 2018-06-27: qty 2

## 2018-06-27 NOTE — Care Management Note (Signed)
Case Management Note  Patient Details  Name: RAYNELLE FUJIKAWA MRN: [id number] Date of Birth: 14-Dec-1940  Subjective/Objective:   Rhabdomyolysis.PMH of dementia, anxiety, insomnia. From home with husband, Jeneen Rinks.        SAREE KROGH (Spouse)     [phone]      PCP: Pricilla Holm  Action/Plan: Transition to home with home health services. Declined SNF placement per PT's recommendations.  [facility] will provide home health services, start of care to begin within 24-48 hrs once d/c.  Husband to provide transportation to home.  Expected Discharge Date:  06/27/18               Expected Discharge Plan:  [place]  In-House Referral:     Discharge planning Services  CM Consult  Post Acute Care Choice:    Choice offered to:  Spouse  DME Arranged:  N/A DME Agency:  NA  HH Arranged:  RN, PT, OT, Nurse's Aide, Social Work [employer] Agency:  [facility]  Status of Service:  Completed, signed off  If discussed at [employer], dates discussed:    Additional Comments:  Sharin Mons, RN 06/27/2018, 11:02 AM

## 2018-06-27 NOTE — Discharge Instructions (Signed)
Follow with Primary MD Crawford, Elizabeth A, MD in 7 days   Get CBC, CMP, checked  by Primary MD next visit.    Activity: As tolerated with Full fall precautions use walker/cane & assistance as needed   Disposition Home    Diet: Heart Healthy  , with feeding assistance and aspiration precautions.  For Heart failure patients - Check your Weight same time everyday, if you gain over 2 pounds, or you develop in leg swelling, experience more shortness of breath or chest pain, call your Primary MD immediately. Follow Cardiac Low Salt Diet and 1.5 lit/day fluid restriction.   On your next visit with your primary care physician please Get Medicines reviewed and adjusted.   Please request your Prim.MD to go over all Hospital Tests and Procedure/Radiological results at the follow up, please get all Hospital records sent to your Prim MD by signing hospital release before you go home.   If you experience worsening of your admission symptoms, develop shortness of breath, life threatening emergency, suicidal or homicidal thoughts you must seek medical attention immediately by calling 911 or calling your MD immediately  if symptoms less severe.  You Must read complete instructions/literature along with all the possible adverse reactions/side effects for all the Medicines you take and that have been prescribed to you. Take any new Medicines after you have completely understood and accpet all the possible adverse reactions/side effects.   Do not drive, operating heavy machinery, perform activities at heights, swimming or participation in water activities or provide baby sitting services if your were admitted for syncope or siezures until you have seen by Primary MD or a Neurologist and advised to do so again.  Do not drive when taking Pain medications.    Do not take more than prescribed Pain, Sleep and Anxiety Medications  Special Instructions: If you have smoked or chewed Tobacco  in the last 2  yrs please stop smoking, stop any regular Alcohol  and or any Recreational drug use.  Wear Seat belts while driving.   Please note  You were cared for by a hospitalist during your hospital stay. If you have any questions about your discharge medications or the care you received while you were in the hospital after you are discharged, you can call the unit and asked to speak with the hospitalist on call if the hospitalist that took care of you is not available. Once you are discharged, your primary care physician will handle any further medical issues. Please note that NO REFILLS for any discharge medications will be authorized once you are discharged, as it is imperative that you return to your primary care physician (or establish a relationship with a primary care physician if you do not have one) for your aftercare needs so that they can reassess your need for medications and monitor your lab values.  

## 2018-06-27 NOTE — Telephone Encounter (Signed)
Tried calling pt to make TCM hosp follow-up appt no answer x's 10 rings. Will retry later.Marland KitchenJohny Bright

## 2018-06-27 NOTE — Discharge Summary (Signed)
Gwendolyn Bright, is a 77 y.o. female  DOB 19-Jun-1941  MRN [id number].  Admission date:  06/24/2018  Admitting Physician  Vianne Bulls, MD  Discharge Date:  06/27/2018   Primary MD  Hoyt Koch, MD  Recommendations for primary care physician for things to follow:  -Please take CBC, BMP during next visit   Admission Diagnosis  Trauma [T14.90XA] Fall, initial encounter [W19.XXXA] Traumatic rhabdomyolysis, initial encounter ([place]) [T79.6XXA] Conjunctivitis of left eye, unspecified conjunctivitis type [H10.9]   Discharge Diagnosis  Trauma [T14.90XA] Fall, initial encounter [W19.XXXA] Traumatic rhabdomyolysis, initial encounter ([place]) [T79.6XXA] Conjunctivitis of left eye, unspecified conjunctivitis type [H10.9]    Principal Problem:   Rhabdomyolysis Active Problems:   Anxiety and depression   Hypokalemia   Dementia   Failure to thrive in adult   Leukocytosis   Pressure injury of skin      Past Medical History:  Diagnosis Date  . Anxiety   . Constipation   . Dementia   . Skin cancer   . UTI (urinary tract infection) 01/2017  . Weight loss     Past Surgical History:  Procedure Laterality Date  . APPENDECTOMY    . CATARACT EXTRACTION    . skin cancer remo         History of present illness and  Hospital Course:     Kindly see H&P for history of present illness and admission details, please review complete Labs, Consult reports and Test reports for all details in brief  HPI  from the history and physical done on the day of admission 06/25/2018   HPI: Gwendolyn Bright is a 77 y.o. female with medical history significant for dementia, anxiety, and insomnia, presenting to the emergency department for evaluation of generalized weakness and anorexia.  History is obtained from the patient's husband at the bedside with patient unable to contribute secondary to her clinical condition  with advanced dementia.  Patient has reportedly been growing increasingly weak in general over the past several months and has had decreased appetite, worsening in recent weeks.  She lives at home with her husband and requires his assistance for ADLs and more recently has required help getting out of bed due to generalized weakness.  She was found on the floor when her husband woke this morning, she was too weak to get up despite his attempts to help her, and so she remained on the floor throughout the day.  This evening, she remained too weak to get up despite her husband's attempts to help and EMS was called.  Patient has not had any complaints.  She has eaten more than a couple bites in the past week.  ED Course: Upon arrival to the ED, patient is found to be afebrile, saturating low 90s on room air, slightly tachycardic, and with stable blood pressure.  EKG features sinus rhythm with minimal ST depression involving diffuse leads.  Chest x-ray is negative for acute cardiopulmonary disease and radiographs of the pelvis are negative.  CT head is negative for  acute intracranial abnormality and no acute findings are noted on cervical spine CT.  Patient also underwent CTA of chest, abdomen, and pelvis with no acute pathology identified.  Chemistry panel is notable for a potassium of 2.8 and bilirubin 1.9.  CK is elevated to 2135.  CBC is notable for leukocytosis to 13,700 and a mild polycythemia.  Lactic acid is elevated to 2.76.  Troponin is negative.  Patient was given 500 cc normal saline in the ED and will be observed on the telemetry unit for ongoing evaluation and management of failure to thrive with generalized weakness, anorexia, and mild rhabdomyolysis.   Hospital Course   77 y.o.femalewith medical history significant fordementia, anxiety, and insomnia, presenting to the emergency department for evaluation of generalized weakness and anorexia, work-up significant for mild rhabdomyolysis and  UTI  Rhabdomyolysis -She presents with generalized weakness, anorexia, poor oral intake, laying on the floor for more than 10 hours, too weak to get up even with her husband's help. -CK elevated at 2135, continues to trend down with gentle hydration, skipped and IV gentle hydration during hospital stay, renal function remained stable, total CK is 759 today on discharge  UTI -Patient  is demented, cannot provide any reliable complaints, she had positive urine analysis, elevated lactic acid, and elevated white blood cell on presentation, was treated with IV Rocephin during hospital stays, final results of urine cultures were still pending at time of discharge .   Hypokalemia - repleted, it is 3.5 today .  Dementia; failure to thrive -Husband reports a gradual decline with decreasing appetite and now no appetite for the past couple weeks -Patient has been increasingly weak in general and requiring husband's assistance to get out of bed -Husband suspects this is secondary to advancing dementia, would like to continue caring for her at home, but feels he may need help -Continue supportive care, PT consulted, recommendation for SNF, but husband wants to take patient home, so will provide some home care  Protein  calorie malnutrition -Is on Ensure     Discharge Condition:  Stable   Follow UP  Follow-up Information    Hoyt Koch, MD Follow up in 1 week(s).   Specialty:  Internal Medicine Contact information: [place] [address] [phone]        [facility] Follow up.   Specialty:  Home Health Services Why:  home health services arranged Contact information: [address] [place] [address] [phone]             Discharge Instructions  and  Discharge Medications     Discharge Instructions    Diet - low sodium heart healthy   Complete by:  As directed    Increase activity slowly   Complete  by:  As directed      Allergies as of 06/27/2018      Reactions   Codeine Nausea And Vomiting   Yellow Jacket Venom [bee Venom] Swelling      Medication List    TAKE these medications   acetaminophen 325 MG tablet Commonly known as:  TYLENOL Take 2 tablets (650 mg total) by mouth every 6 (six) hours as needed for mild pain (or Fever >/= 101).   feeding supplement (ENSURE ENLIVE) Liqd Take 237 mLs by mouth 2 (two) times daily between meals.   SYSTANE OP Apply 1-2 drops to eye daily as needed (dryness).   zolpidem 5 MG tablet Commonly known as:  AMBIEN Take  1 tablet (5 mg total) by mouth at bedtime. What changed:    when to take this  reasons to take this         Diet and Activity recommendation: See Discharge Instructions above   Consults obtained -  None  Major procedures and Radiology Reports - PLEASE review detailed and final reports for all details, in brief -      Ct Head Wo Contrast  Result Date: 06/24/2018 CLINICAL DATA:  77 year old female with dementia. EXAM: CT HEAD WITHOUT CONTRAST CT CERVICAL SPINE WITHOUT CONTRAST TECHNIQUE: Multidetector CT imaging of the head and cervical spine was performed following the standard protocol without intravenous contrast. Multiplanar CT image reconstructions of the cervical spine were also generated. COMPARISON:  Brain MRI dated 02/22/2017 FINDINGS: CT HEAD FINDINGS Brain: Mild to moderate age-related atrophy and chronic microvascular ischemic changes. There is no acute intracranial hemorrhage. No mass effect or midline shift. No extra-axial fluid collection. Vascular: No hyperdense vessel or unexpected calcification. Skull: Normal. Negative for fracture or focal lesion. Sinuses/Orbits: No acute finding. Other: None CT CERVICAL SPINE FINDINGS Alignment: No acute subluxation. Skull base and vertebrae: No acute fracture. No primary bone lesion or focal pathologic process. Soft tissues and spinal canal: No prevertebral fluid  or swelling. No visible canal hematoma. Disc levels:  Mild degenerative changes. Upper chest: Biapical subpleural scarring. Other: None IMPRESSION: 1. No acute intracranial hemorrhage. 2. Age-related atrophy and chronic microvascular ischemic changes. 3. No acute/traumatic cervical spine pathology. Electronically Signed   By: Anner Crete M.D.   On: 06/24/2018 22:04   Ct Chest W Contrast  Result Date: 06/24/2018 CLINICAL DATA:  Blunt trauma. History of dementia. Patient found on the floor by family this evening. EXAM: CT CHEST, ABDOMEN, AND PELVIS WITH CONTRAST TECHNIQUE: Multidetector CT imaging of the chest, abdomen and pelvis was performed following the standard protocol during bolus administration of intravenous contrast. CONTRAST:  145mL OMNIPAQUE IOHEXOL 300 MG/ML  SOLN COMPARISON:  12/05/2017 chest CT, CT abdomen and pelvis 12/05/2017 FINDINGS: CT CHEST FINDINGS CARDIOVASCULAR: Heart size is normal. No pericardial effusion. Nonaneurysmal atherosclerotic thoracic aorta. No mediastinal hematoma. No large central pulmonary embolus. Great vessels are unremarkable and patent. MEDIASTINUM/NODES: Unremarkable thyroid gland. No lymphadenopathy by CT size criteria. Patent trachea and mainstem bronchi. CT appearance of the esophagus is unremarkable. No hilar lymphadenopathy is noted. Midline patent trachea and mainstem bronchi. LUNGS/PLEURA: No acute pulmonary consolidation, effusion or pneumothorax. No dominant mass is seen. Scarring at the apices with right upper lobe bronchiectasis and dystrophic calcifications. Lesser degree of apical pleuroparenchymal scarring with calcifications on the left. No pulmonary contusion, effusion or pneumothorax. MUSCULOSKELETAL: Calcified subglandular bilateral breast implants. No acute osseous appearing abnormality is noted. CT ABDOMEN AND PELVIS FINDINGS HEPATIC BILIARY: Steatosis of the liver. No liver laceration or subcapsular fluid. Physiologic distention of the  gallbladder without stones. No biliary dilatation is identified. PANCREAS: No pancreatic mass, inflammation or ductal dilatation. SPLEEN: Normal size spleen without mass. ADRENALS/URINARY TRACT: Normal bilateral adrenal glands and kidneys. No enhancing mass lesion of either kidney. Stable 8 mm cortical cyst in the lower pole the right kidney. No nephrolithiasis nor obstructive uropathy. The ureters are not dilated. No hydroureteronephrosis is noted. Distended urinary bladder without focal mural thickening or calculus. STOMACH/BOWEL: The stomach, small and large bowel are normal in course and caliber without mural thickening or inflammation. No mechanical bowel obstruction is noted. Appendectomy. VASCULAR/LYMPHATIC: Nonaneurysmal thoracic aorta. No lymphadenopathy by CT size criteria. REPRODUCTIVE: Hysterectomy.  No adnexal mass. OTHER: No free  air nor free fluid. MUSCULOSKELETAL: Mild dextroconvex curvature of the lumbar spine. No acute nor suspicious osseous lesions. IMPRESSION: 1. No acute chest, abdomen or pelvic abnormality. 2. Chronic stable pleuroparenchymal changes and scarring at the apices of both lungs, right middle lobe and lingula. 3. No acute solid nor hollow visceral organ injury. 4. Mild hepatic steatosis. Electronically Signed   By: Ashley Royalty M.D.   On: 06/24/2018 22:08   Ct Cervical Spine Wo Contrast  Result Date: 06/24/2018 CLINICAL DATA:  77 year old female with dementia. EXAM: CT HEAD WITHOUT CONTRAST CT CERVICAL SPINE WITHOUT CONTRAST TECHNIQUE: Multidetector CT imaging of the head and cervical spine was performed following the standard protocol without intravenous contrast. Multiplanar CT image reconstructions of the cervical spine were also generated. COMPARISON:  Brain MRI dated 02/22/2017 FINDINGS: CT HEAD FINDINGS Brain: Mild to moderate age-related atrophy and chronic microvascular ischemic changes. There is no acute intracranial hemorrhage. No mass effect or midline shift. No  extra-axial fluid collection. Vascular: No hyperdense vessel or unexpected calcification. Skull: Normal. Negative for fracture or focal lesion. Sinuses/Orbits: No acute finding. Other: None CT CERVICAL SPINE FINDINGS Alignment: No acute subluxation. Skull base and vertebrae: No acute fracture. No primary bone lesion or focal pathologic process. Soft tissues and spinal canal: No prevertebral fluid or swelling. No visible canal hematoma. Disc levels:  Mild degenerative changes. Upper chest: Biapical subpleural scarring. Other: None IMPRESSION: 1. No acute intracranial hemorrhage. 2. Age-related atrophy and chronic microvascular ischemic changes. 3. No acute/traumatic cervical spine pathology. Electronically Signed   By: Anner Crete M.D.   On: 06/24/2018 22:04   Ct Abdomen Pelvis W Contrast  Result Date: 06/24/2018 CLINICAL DATA:  Blunt trauma. History of dementia. Patient found on the floor by family this evening. EXAM: CT CHEST, ABDOMEN, AND PELVIS WITH CONTRAST TECHNIQUE: Multidetector CT imaging of the chest, abdomen and pelvis was performed following the standard protocol during bolus administration of intravenous contrast. CONTRAST:  174mL OMNIPAQUE IOHEXOL 300 MG/ML  SOLN COMPARISON:  12/05/2017 chest CT, CT abdomen and pelvis 12/05/2017 FINDINGS: CT CHEST FINDINGS CARDIOVASCULAR: Heart size is normal. No pericardial effusion. Nonaneurysmal atherosclerotic thoracic aorta. No mediastinal hematoma. No large central pulmonary embolus. Great vessels are unremarkable and patent. MEDIASTINUM/NODES: Unremarkable thyroid gland. No lymphadenopathy by CT size criteria. Patent trachea and mainstem bronchi. CT appearance of the esophagus is unremarkable. No hilar lymphadenopathy is noted. Midline patent trachea and mainstem bronchi. LUNGS/PLEURA: No acute pulmonary consolidation, effusion or pneumothorax. No dominant mass is seen. Scarring at the apices with right upper lobe bronchiectasis and dystrophic  calcifications. Lesser degree of apical pleuroparenchymal scarring with calcifications on the left. No pulmonary contusion, effusion or pneumothorax. MUSCULOSKELETAL: Calcified subglandular bilateral breast implants. No acute osseous appearing abnormality is noted. CT ABDOMEN AND PELVIS FINDINGS HEPATIC BILIARY: Steatosis of the liver. No liver laceration or subcapsular fluid. Physiologic distention of the gallbladder without stones. No biliary dilatation is identified. PANCREAS: No pancreatic mass, inflammation or ductal dilatation. SPLEEN: Normal size spleen without mass. ADRENALS/URINARY TRACT: Normal bilateral adrenal glands and kidneys. No enhancing mass lesion of either kidney. Stable 8 mm cortical cyst in the lower pole the right kidney. No nephrolithiasis nor obstructive uropathy. The ureters are not dilated. No hydroureteronephrosis is noted. Distended urinary bladder without focal mural thickening or calculus. STOMACH/BOWEL: The stomach, small and large bowel are normal in course and caliber without mural thickening or inflammation. No mechanical bowel obstruction is noted. Appendectomy. VASCULAR/LYMPHATIC: Nonaneurysmal thoracic aorta. No lymphadenopathy by CT size criteria. REPRODUCTIVE: Hysterectomy.  No adnexal mass. OTHER: No free air nor free fluid. MUSCULOSKELETAL: Mild dextroconvex curvature of the lumbar spine. No acute nor suspicious osseous lesions. IMPRESSION: 1. No acute chest, abdomen or pelvic abnormality. 2. Chronic stable pleuroparenchymal changes and scarring at the apices of both lungs, right middle lobe and lingula. 3. No acute solid nor hollow visceral organ injury. 4. Mild hepatic steatosis. Electronically Signed   By: Ashley Royalty M.D.   On: 06/24/2018 22:08   Dg Pelvis Portable  Result Date: 06/24/2018 CLINICAL DATA:  Unwitnessed fall. EXAM: PORTABLE PELVIS 1-2 VIEWS COMPARISON:  Radiographs of May 30, 2008. FINDINGS: There is no evidence of pelvic fracture or diastasis. No  pelvic bone lesions are seen. IMPRESSION: Negative. Electronically Signed   By: Marijo Conception, M.D.   On: 06/24/2018 20:54   Ct T-spine No Charge  Result Date: 06/24/2018 CLINICAL DATA:  Patient found down on floor by family. Blunt trauma. EXAM: CT THORACIC SPINE WITHOUT CONTRAST TECHNIQUE: Multidetector CT images of the thoracic were obtained using the standard protocol without intravenous contrast. COMPARISON:  None. FINDINGS: Alignment: Normal. Vertebrae: No acute fracture or focal pathologic process. Twelve paired thoracic ribs without acute fracture of the visualized portions. Paraspinal and other soft tissues: Visible canal hematoma. No suspicious osseous lesions. No paraspinal soft tissue mass or hematoma. Disc levels: No focal disc herniation or significant foraminal or canal stenosis. IMPRESSION: No acute osseous abnormality of the thoracic spine. Electronically Signed   By: Ashley Royalty M.D.   On: 06/24/2018 22:12   Ct L-spine No Charge  Result Date: 06/24/2018 CLINICAL DATA:  Patient found on floor by family.  Lower back pain. EXAM: CT LUMBAR SPINE WITHOUT CONTRAST TECHNIQUE: Multidetector CT imaging of the lumbar spine was performed without intravenous contrast administration. Multiplanar CT image reconstructions were also generated. COMPARISON:  None. FINDINGS: Segmentation: Standard Alignment: Maintained lumbar lordosis. No pars defects or listhesis. Vertebrae: No acute fracture or focal pathologic process. Paraspinal and other soft tissues: Negative. Disc levels: No significant central or foraminal stenosis. Slight disc space narrowing and vacuum phenomenon at L1-2 and L2-3. IMPRESSION: No acute lumbar spine fracture or listhesis. 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 Electronically Signed   By: Ashley Royalty M.D.   On: 06/24/2018 22:14   Dg Chest Port 1 View  Result Date: 06/24/2018 CLINICAL DATA:  Fall. EXAM: PORTABLE CHEST 1 VIEW COMPARISON:  Radiographs and CT scan of December 05, 2017. FINDINGS: The heart size and mediastinal contours are within normal limits. Both lungs are clear. No pneumothorax or pleural effusion is noted. The visualized skeletal structures are unremarkable. IMPRESSION: No acute cardiopulmonary abnormality seen. Electronically Signed   By: Marijo Conception, M.D.   On: 06/24/2018 20:53    Micro Results    Recent Results (from the past 240 hour(s))  Culture, Urine     Status: None   Collection Time: 06/25/18  4:49 PM  Result Value Ref Range Status   Specimen Description URINE, CATHETERIZED  Final   Special Requests NONE  Final   Culture   Final    NO GROWTH Performed at [facility], [address], [place], [place] [address]    Report Status 06/27/2018 FINAL  Final       Today   Subjective:   Jeremy Johann with no significant events overnight, she herself denies any complaint, but she is demented  Objective:   Blood pressure 117/64, pulse 67, temperature 98.2 F (36.8 C), resp. rate 16, height  5\' 6"  (1.676 m), weight 44.9 kg, SpO2 97 %.  No intake or output data in the 24 hours ending 06/27/18 1144  Exam Frail, cachectic, elderly female, in bed in no apparent distress, conversant, pleasant, she is awake alert x1, Good air entry bilaterally, no wheezing or rales  Regular rate and rhythm, no rubs or gallops  Soft, nontender, nondistended  Extremity with no edema, clubbing or cyanosis, has significant muscle wasting .   Data Review   CBC w Diff:  Lab Results  Component Value Date   WBC 10.4 06/26/2018   HGB 12.2 06/26/2018   HCT 37.7 06/26/2018   HCT 41.3 02/22/2017   PLT 168 06/26/2018   LYMPHOPCT 14 06/25/2018   MONOPCT 11 06/25/2018   EOSPCT 0 06/25/2018   BASOPCT 0 06/25/2018    CMP:  Lab Results  Component Value Date   NA 141 06/27/2018   K 3.5 06/27/2018   CL 106 06/27/2018   CO2 25 06/27/2018   BUN 12 06/27/2018   CREATININE 0.50 06/27/2018   PROT 7.8 06/24/2018   ALBUMIN 3.6 06/24/2018    BILITOT 1.9 (H) 06/24/2018   ALKPHOS 94 06/24/2018   AST 57 (H) 06/24/2018   ALT 32 06/24/2018  .   Total Time in preparing paper work, data evaluation and todays exam - 44 minutes  Phillips Climes M.D on 06/27/2018 at 11:44 AM  [facility]   Office  [phone]

## 2018-06-29 DIAGNOSIS — G4700 Insomnia, unspecified: Secondary | ICD-10-CM | POA: Diagnosis not present

## 2018-06-29 DIAGNOSIS — F329 Major depressive disorder, single episode, unspecified: Secondary | ICD-10-CM | POA: Diagnosis not present

## 2018-06-29 DIAGNOSIS — Z87440 Personal history of urinary (tract) infections: Secondary | ICD-10-CM | POA: Diagnosis not present

## 2018-06-29 DIAGNOSIS — F0390 Unspecified dementia without behavioral disturbance: Secondary | ICD-10-CM | POA: Diagnosis not present

## 2018-06-29 DIAGNOSIS — M6281 Muscle weakness (generalized): Secondary | ICD-10-CM | POA: Diagnosis not present

## 2018-06-29 DIAGNOSIS — E43 Unspecified severe protein-calorie malnutrition: Secondary | ICD-10-CM | POA: Diagnosis not present

## 2018-06-29 DIAGNOSIS — I454 Nonspecific intraventricular block: Secondary | ICD-10-CM | POA: Diagnosis not present

## 2018-06-29 DIAGNOSIS — H109 Unspecified conjunctivitis: Secondary | ICD-10-CM | POA: Diagnosis not present

## 2018-06-29 DIAGNOSIS — F419 Anxiety disorder, unspecified: Secondary | ICD-10-CM | POA: Diagnosis not present

## 2018-06-30 ENCOUNTER — Telehealth: Payer: Self-pay | Admitting: Internal Medicine

## 2018-06-30 NOTE — Telephone Encounter (Unsigned)
Copied from [place] [phone]. Topic: General - Other >> Jun 30, 2018  8:54 AM Carolyn Stare wrote:  Dan Europe with [facility]  call to req verbal PT orders for 2 x 5 effective 06/29/18      Phone

## 2018-06-30 NOTE — Telephone Encounter (Signed)
Called pt again no answer LMOM RTC to schedule hosp f/u appt. Sent CRM to [facility] for FYI.Marland KitchenJohny Chess

## 2018-07-01 NOTE — Telephone Encounter (Signed)
Cannot approve any verbals until there is a future appointment within 30 days of start date of home health services.

## 2018-07-01 NOTE — Telephone Encounter (Signed)
LVM for patient to call back and make appt  °

## 2018-07-01 NOTE — Telephone Encounter (Signed)
Can you please make patient an appointment if wanting any type of PT. Thank you

## 2018-07-02 DIAGNOSIS — H109 Unspecified conjunctivitis: Secondary | ICD-10-CM | POA: Diagnosis not present

## 2018-07-02 DIAGNOSIS — F419 Anxiety disorder, unspecified: Secondary | ICD-10-CM | POA: Diagnosis not present

## 2018-07-02 DIAGNOSIS — F329 Major depressive disorder, single episode, unspecified: Secondary | ICD-10-CM | POA: Diagnosis not present

## 2018-07-02 DIAGNOSIS — I454 Nonspecific intraventricular block: Secondary | ICD-10-CM | POA: Diagnosis not present

## 2018-07-02 DIAGNOSIS — M6281 Muscle weakness (generalized): Secondary | ICD-10-CM | POA: Diagnosis not present

## 2018-07-02 DIAGNOSIS — F0390 Unspecified dementia without behavioral disturbance: Secondary | ICD-10-CM | POA: Diagnosis not present

## 2018-07-02 DIAGNOSIS — Z87440 Personal history of urinary (tract) infections: Secondary | ICD-10-CM | POA: Diagnosis not present

## 2018-07-02 DIAGNOSIS — G4700 Insomnia, unspecified: Secondary | ICD-10-CM | POA: Diagnosis not present

## 2018-07-02 DIAGNOSIS — E43 Unspecified severe protein-calorie malnutrition: Secondary | ICD-10-CM | POA: Diagnosis not present

## 2018-07-04 ENCOUNTER — Telehealth: Payer: Self-pay | Admitting: Internal Medicine

## 2018-07-04 NOTE — Telephone Encounter (Signed)
Copied from [place] [phone]. Topic: General - Other >> Jul 04, 2018  1:22 PM Keene Breath wrote: Reason for CRM: Nicollette from [facility] called to get verbal orders for PT for patient - 2x wk for 5 wks.  Please advise.  CB# [phone]

## 2018-07-04 NOTE — Telephone Encounter (Signed)
Tried calling Gwendolyn Bright back to inform her of MD response but mailbox was full and could not leave a message. A voicemail was also left for patient by Sam to call back and make an appointment. No attempt of an appointment made yet. Routing to [place] anyone calls back about verbal orders for patient

## 2018-07-04 NOTE — Telephone Encounter (Signed)
Cannot have verbal orders until hospital follow up is scheduled (must be within 30 days of start date of services).

## 2018-07-07 DIAGNOSIS — F329 Major depressive disorder, single episode, unspecified: Secondary | ICD-10-CM | POA: Diagnosis not present

## 2018-07-07 DIAGNOSIS — Z87440 Personal history of urinary (tract) infections: Secondary | ICD-10-CM | POA: Diagnosis not present

## 2018-07-07 DIAGNOSIS — G4700 Insomnia, unspecified: Secondary | ICD-10-CM | POA: Diagnosis not present

## 2018-07-07 DIAGNOSIS — F0390 Unspecified dementia without behavioral disturbance: Secondary | ICD-10-CM | POA: Diagnosis not present

## 2018-07-07 DIAGNOSIS — M6281 Muscle weakness (generalized): Secondary | ICD-10-CM | POA: Diagnosis not present

## 2018-07-07 DIAGNOSIS — F419 Anxiety disorder, unspecified: Secondary | ICD-10-CM | POA: Diagnosis not present

## 2018-07-07 DIAGNOSIS — I454 Nonspecific intraventricular block: Secondary | ICD-10-CM | POA: Diagnosis not present

## 2018-07-07 DIAGNOSIS — H109 Unspecified conjunctivitis: Secondary | ICD-10-CM | POA: Diagnosis not present

## 2018-07-07 DIAGNOSIS — E43 Unspecified severe protein-calorie malnutrition: Secondary | ICD-10-CM | POA: Diagnosis not present

## 2018-07-15 DIAGNOSIS — M6281 Muscle weakness (generalized): Secondary | ICD-10-CM | POA: Diagnosis not present

## 2018-07-15 DIAGNOSIS — E43 Unspecified severe protein-calorie malnutrition: Secondary | ICD-10-CM | POA: Diagnosis not present

## 2018-07-15 DIAGNOSIS — G4700 Insomnia, unspecified: Secondary | ICD-10-CM | POA: Diagnosis not present

## 2018-07-15 DIAGNOSIS — I454 Nonspecific intraventricular block: Secondary | ICD-10-CM | POA: Diagnosis not present

## 2018-07-15 DIAGNOSIS — Z87440 Personal history of urinary (tract) infections: Secondary | ICD-10-CM | POA: Diagnosis not present

## 2018-07-15 DIAGNOSIS — F419 Anxiety disorder, unspecified: Secondary | ICD-10-CM | POA: Diagnosis not present

## 2018-07-15 DIAGNOSIS — F0390 Unspecified dementia without behavioral disturbance: Secondary | ICD-10-CM | POA: Diagnosis not present

## 2018-07-15 DIAGNOSIS — H109 Unspecified conjunctivitis: Secondary | ICD-10-CM | POA: Diagnosis not present

## 2018-07-15 DIAGNOSIS — F329 Major depressive disorder, single episode, unspecified: Secondary | ICD-10-CM | POA: Diagnosis not present

## 2018-07-17 DIAGNOSIS — F0390 Unspecified dementia without behavioral disturbance: Secondary | ICD-10-CM | POA: Diagnosis not present

## 2018-07-17 DIAGNOSIS — Z87440 Personal history of urinary (tract) infections: Secondary | ICD-10-CM | POA: Diagnosis not present

## 2018-07-17 DIAGNOSIS — I454 Nonspecific intraventricular block: Secondary | ICD-10-CM | POA: Diagnosis not present

## 2018-07-17 DIAGNOSIS — M6281 Muscle weakness (generalized): Secondary | ICD-10-CM | POA: Diagnosis not present

## 2018-07-17 DIAGNOSIS — F329 Major depressive disorder, single episode, unspecified: Secondary | ICD-10-CM | POA: Diagnosis not present

## 2018-07-17 DIAGNOSIS — E43 Unspecified severe protein-calorie malnutrition: Secondary | ICD-10-CM | POA: Diagnosis not present

## 2018-07-17 DIAGNOSIS — F419 Anxiety disorder, unspecified: Secondary | ICD-10-CM | POA: Diagnosis not present

## 2018-07-17 DIAGNOSIS — G4700 Insomnia, unspecified: Secondary | ICD-10-CM | POA: Diagnosis not present

## 2018-07-17 DIAGNOSIS — H109 Unspecified conjunctivitis: Secondary | ICD-10-CM | POA: Diagnosis not present

## 2018-07-30 DIAGNOSIS — H109 Unspecified conjunctivitis: Secondary | ICD-10-CM | POA: Diagnosis not present

## 2018-07-30 DIAGNOSIS — G4700 Insomnia, unspecified: Secondary | ICD-10-CM | POA: Diagnosis not present

## 2018-07-30 DIAGNOSIS — M6281 Muscle weakness (generalized): Secondary | ICD-10-CM | POA: Diagnosis not present

## 2018-07-30 DIAGNOSIS — E43 Unspecified severe protein-calorie malnutrition: Secondary | ICD-10-CM | POA: Diagnosis not present

## 2018-07-30 DIAGNOSIS — I454 Nonspecific intraventricular block: Secondary | ICD-10-CM | POA: Diagnosis not present

## 2018-07-30 DIAGNOSIS — F329 Major depressive disorder, single episode, unspecified: Secondary | ICD-10-CM | POA: Diagnosis not present

## 2018-07-30 DIAGNOSIS — Z87440 Personal history of urinary (tract) infections: Secondary | ICD-10-CM | POA: Diagnosis not present

## 2018-07-30 DIAGNOSIS — F419 Anxiety disorder, unspecified: Secondary | ICD-10-CM | POA: Diagnosis not present

## 2018-07-30 DIAGNOSIS — F0390 Unspecified dementia without behavioral disturbance: Secondary | ICD-10-CM | POA: Diagnosis not present

## 2018-08-05 ENCOUNTER — Telehealth: Payer: Self-pay | Admitting: Internal Medicine

## 2018-08-05 DIAGNOSIS — F329 Major depressive disorder, single episode, unspecified: Secondary | ICD-10-CM | POA: Diagnosis not present

## 2018-08-05 DIAGNOSIS — G4700 Insomnia, unspecified: Secondary | ICD-10-CM | POA: Diagnosis not present

## 2018-08-05 DIAGNOSIS — F419 Anxiety disorder, unspecified: Secondary | ICD-10-CM | POA: Diagnosis not present

## 2018-08-05 DIAGNOSIS — E43 Unspecified severe protein-calorie malnutrition: Secondary | ICD-10-CM | POA: Diagnosis not present

## 2018-08-05 DIAGNOSIS — I454 Nonspecific intraventricular block: Secondary | ICD-10-CM | POA: Diagnosis not present

## 2018-08-05 DIAGNOSIS — M6281 Muscle weakness (generalized): Secondary | ICD-10-CM | POA: Diagnosis not present

## 2018-08-05 DIAGNOSIS — F0390 Unspecified dementia without behavioral disturbance: Secondary | ICD-10-CM | POA: Diagnosis not present

## 2018-08-05 DIAGNOSIS — H109 Unspecified conjunctivitis: Secondary | ICD-10-CM | POA: Diagnosis not present

## 2018-08-05 DIAGNOSIS — Z87440 Personal history of urinary (tract) infections: Secondary | ICD-10-CM | POA: Diagnosis not present

## 2018-08-05 NOTE — Telephone Encounter (Signed)
fyi

## 2018-08-05 NOTE — Telephone Encounter (Signed)
Copied from [place] [phone]. Topic: General - Other >> Aug 05, 2018 11:07 AM Valla Leaver wrote: Reason for CRM: Lattie Haw, RN with [facility] calling to notify Dr. Sharlet Salina that the patient is stable and doing better and is ok to discharge but would like a call once Thailyn is seen on tomorrow 08/06/2018 to assist with scheduling a PT eval.

## 2018-08-06 ENCOUNTER — Encounter: Payer: Self-pay | Admitting: Internal Medicine

## 2018-08-06 ENCOUNTER — Other Ambulatory Visit (INDEPENDENT_AMBULATORY_CARE_PROVIDER_SITE_OTHER): Payer: Medicare HMO

## 2018-08-06 ENCOUNTER — Ambulatory Visit (INDEPENDENT_AMBULATORY_CARE_PROVIDER_SITE_OTHER): Payer: Medicare HMO | Admitting: Internal Medicine

## 2018-08-06 VITALS — BP 110/60 | HR 65 | Temp 98.6°F | Ht 66.0 in | Wt 95.0 lb

## 2018-08-06 DIAGNOSIS — T796XXD Traumatic ischemia of muscle, subsequent encounter: Secondary | ICD-10-CM | POA: Diagnosis not present

## 2018-08-06 DIAGNOSIS — E43 Unspecified severe protein-calorie malnutrition: Secondary | ICD-10-CM

## 2018-08-06 DIAGNOSIS — F0390 Unspecified dementia without behavioral disturbance: Secondary | ICD-10-CM

## 2018-08-06 DIAGNOSIS — F5101 Primary insomnia: Secondary | ICD-10-CM | POA: Diagnosis not present

## 2018-08-06 LAB — CBC
HCT: 43.6 % (ref 36.0–46.0)
Hemoglobin: 14.5 g/dL (ref 12.0–15.0)
MCHC: 33.3 g/dL (ref 30.0–36.0)
MCV: 90.6 fl (ref 78.0–100.0)
PLATELETS: 208 10*3/uL (ref 150.0–400.0)
RBC: 4.81 Mil/uL (ref 3.87–5.11)
RDW: 16.2 % — ABNORMAL HIGH (ref 11.5–15.5)
WBC: 7.8 10*3/uL (ref 4.0–10.5)

## 2018-08-06 LAB — COMPREHENSIVE METABOLIC PANEL
ALT: 12 U/L (ref 0–35)
AST: 22 U/L (ref 0–37)
Albumin: 4.4 g/dL (ref 3.5–5.2)
Alkaline Phosphatase: 87 U/L (ref 39–117)
BILIRUBIN TOTAL: 1.1 mg/dL (ref 0.2–1.2)
BUN: 21 mg/dL (ref 6–23)
CALCIUM: 9.8 mg/dL (ref 8.4–10.5)
CO2: 25 mEq/L (ref 19–32)
CREATININE: 0.71 mg/dL (ref 0.40–1.20)
Chloride: 105 mEq/L (ref 96–112)
GFR: 84.23 mL/min (ref >60.00)
Glucose, Bld: 74 mg/dL (ref 70–99)
Potassium: 4.1 mEq/L (ref 3.5–5.1)
Sodium: 142 mEq/L (ref 135–145)
Total Protein: 7.4 g/dL (ref 6.0–8.3)

## 2018-08-06 LAB — CK: Total CK: 131 U/L (ref 7–177)

## 2018-08-06 MED ORDER — ZOLPIDEM TARTRATE 5 MG PO TABS: 5.0000 mg | ORAL_TABLET | Freq: Every day | ORAL | 3 refills | Status: DC

## 2018-08-06 NOTE — Progress Notes (Signed)
   Subjective:    Patient ID: Gwendolyn Bright, female    DOB: October 01, 1941, 77 y.o.   MRN: [id number]  HPI The patient is a 77 YO female coming in for hospital follow up (in for fall with rhabdomyolysis about 2 months ago due to her husband not being able to get her up and she was not eating for about 1 week prior to fall and could not help). She was given fluids and sent home with therapy. She did complete that and is eating okay now. Using ensure. Due to her memory problems her husband provides most of the history. She denies any problems.   PMH, [facility], social history reviewed and updated.   Review of Systems  Unable to perform ROS: Dementia      Objective:   Physical Exam  Constitutional: She appears well-developed and well-nourished.  Thin, temporal wasting  HENT:  Head: Normocephalic and atraumatic.  Eyes: EOM are normal.  Neck: Normal range of motion.  Cardiovascular: Normal rate and regular rhythm.  Pulmonary/Chest: Effort normal and breath sounds normal. No respiratory distress. She has no wheezes. She has no rales.  Abdominal: Soft. Bowel sounds are normal. She exhibits no distension. There is no tenderness. There is no rebound.  Musculoskeletal: She exhibits no edema.  Neurological: She is alert. Coordination normal.  Skin: Skin is warm and dry.   Vitals:   08/06/18 0949  BP: 110/60  Pulse: 65  Temp: 98.6 F (37 C)  TempSrc: Oral  SpO2: 96%  Weight: 95 lb (43.1 kg)  Height: 5\' 6"  (1.676 m)      Assessment & Plan:

## 2018-08-06 NOTE — Patient Instructions (Addendum)
We should check the blood work today and get you the prescription for the supplies for home.   We will have the palliative care people check in on you.

## 2018-08-08 ENCOUNTER — Encounter: Payer: Self-pay | Admitting: Internal Medicine

## 2018-08-08 DIAGNOSIS — I454 Nonspecific intraventricular block: Secondary | ICD-10-CM | POA: Diagnosis not present

## 2018-08-08 DIAGNOSIS — E43 Unspecified severe protein-calorie malnutrition: Secondary | ICD-10-CM | POA: Diagnosis not present

## 2018-08-08 DIAGNOSIS — F0390 Unspecified dementia without behavioral disturbance: Secondary | ICD-10-CM | POA: Diagnosis not present

## 2018-08-08 DIAGNOSIS — Z87440 Personal history of urinary (tract) infections: Secondary | ICD-10-CM

## 2018-08-08 DIAGNOSIS — Z9181 History of falling: Secondary | ICD-10-CM

## 2018-08-08 DIAGNOSIS — H109 Unspecified conjunctivitis: Secondary | ICD-10-CM

## 2018-08-08 DIAGNOSIS — G4700 Insomnia, unspecified: Secondary | ICD-10-CM | POA: Diagnosis not present

## 2018-08-08 DIAGNOSIS — F419 Anxiety disorder, unspecified: Secondary | ICD-10-CM

## 2018-08-08 DIAGNOSIS — M6281 Muscle weakness (generalized): Secondary | ICD-10-CM | POA: Diagnosis not present

## 2018-08-08 DIAGNOSIS — F329 Major depressive disorder, single episode, unspecified: Secondary | ICD-10-CM | POA: Diagnosis not present

## 2018-08-08 NOTE — Assessment & Plan Note (Signed)
She is still having weight loss and temporal wasting although she states she is drinking ensures daily.

## 2018-08-08 NOTE — Assessment & Plan Note (Signed)
Checking CK, CBC, CMP for follow up today. Adjust as needed. She is not drinking a lot of fluids. Does not feel the need for more PT at this time.

## 2018-08-08 NOTE — Assessment & Plan Note (Signed)
Progression since last visit and she is not having behavioral problems. She is not wanting to take medication for memory so is not on anything for it currently.

## 2018-08-08 NOTE — Assessment & Plan Note (Signed)
Refill ambien

## 2018-08-22 ENCOUNTER — Telehealth: Payer: Self-pay

## 2018-08-22 NOTE — Telephone Encounter (Signed)
Phone call to patient to schedule visit with Palliative Care. Scheduled for 08/26/18. Patient requested a call when NP is on her way.

## 2018-08-26 ENCOUNTER — Other Ambulatory Visit: Payer: Medicare HMO | Admitting: Internal Medicine

## 2018-08-26 DIAGNOSIS — R413 Other amnesia: Secondary | ICD-10-CM | POA: Diagnosis not present

## 2018-08-26 DIAGNOSIS — Z7189 Other specified counseling: Secondary | ICD-10-CM

## 2018-08-26 DIAGNOSIS — R5383 Other fatigue: Secondary | ICD-10-CM

## 2018-08-27 NOTE — Progress Notes (Signed)
[facility] Telephone: [phone] Fax: [phone]  PATIENT NAME: Gwendolyn Bright DOB: October 11, 1940 MRN: [id number]  PRIMARY CARE PROVIDER:   Hoyt Koch, MD  REFERRING PROVIDER:  Hoyt Koch, MD [place], [place] [address]  RESPONSIBLE PARTY:   Navada Osterhout (husband) [phone]     RECOMMENDATIONS and PLAN:  1.  Palliative care encounter Z51.5:  -Advanced care planning:  Goals of care per husband and pt are to improve nutritional status and remain safe in home environment.  Reviewed code status and MOST forms with selection of DNAR/DNI, intermediate interventions, determine use of antibiotics and trial IV fluids.  Documents were completed and left with husband.  Plan on follow-up for re-evaluation.    -Memory Loss:  FAST stage 6d.  Provide safe environment.  Consider adult day care for additional support.  She will need additional assistance and dementia progresses or with recurrent illness.  -Fatigue:  Related to poor nutritional and underweight status. 5\' 6"  at 99#.  BMI =15.9.  Increase Ensure to TID along with routine meals.  Improve hydration.  Weigh weekly.  Fall risk prevention discussed.  Continue to monitor.  I spent 40 minutes providing this consultation in the home,  from 3:00pm to 3:40pm at home. More than 50% of the time in this consultation was spent coordinating communication patient and her husband.  Medical records and medications reviewed with couple.  HISTORY OF PRESENT ILLNESS:  Gwendolyn Bright is a 77 y.o. year old female with multiple medical problems including dementia with weight loss.  She was hospitalized in Sept due to Rhabdomyolysis as a result of a fall at home and a UTI. Husband reports that she eats very little and occasionally drinks Ensure.  Reports some confusion and has wandered away from home previously.  She is ambulatory but  feels fatigued.  She does require minimal assistance with ADLs and toileting.  Her husband prepares meals.  Palliative Care was asked to help address goals of care.   CODE STATUS:  DNR  PPS: 40% HOSPICE ELIGIBILITY/DIAGNOSIS: TBD  PAST MEDICAL HISTORY:  Past Medical History:  Diagnosis Date  . Anxiety   . Constipation   . Dementia ([place])   . Skin cancer   . UTI (urinary tract infection) 01/2017  . Weight loss     ALLERGIES:  Allergies  Allergen Reactions  . Codeine Nausea And Vomiting  . Yellow Jacket Venom [Bee Venom] Swelling     PERTINENT MEDICATIONS:   PHYSICAL EXAM:   General: NAD, frail and fragile appearing, thin Cardiovascular: regular rate and rhythm Pulmonary: clear ant fields Abdomen: soft, nontender, + bowel sounds GU: no suprapubic tenderness Extremities: no edema, decreased muscle mass Skin: exposed skin is intact Neurological: A&O to person and place.  Speaks in brief sentences.  Follows commands. Unable to give accurate detailed information. Psych:  Cooperative.  Mild agitation when husband corrects pt statement  Gonzella Lex, NP-C

## 2018-09-16 ENCOUNTER — Encounter: Payer: Self-pay | Admitting: Internal Medicine

## 2018-09-16 ENCOUNTER — Ambulatory Visit (INDEPENDENT_AMBULATORY_CARE_PROVIDER_SITE_OTHER): Payer: Medicare HMO | Admitting: Internal Medicine

## 2018-09-16 ENCOUNTER — Ambulatory Visit (INDEPENDENT_AMBULATORY_CARE_PROVIDER_SITE_OTHER)
Admission: RE | Admit: 2018-09-16 | Discharge: 2018-09-16 | Disposition: A | Discharge status: Home / Self Care | Payer: Medicare HMO | Source: Ambulatory Visit | Attending: Internal Medicine | Admitting: Internal Medicine

## 2018-09-16 VITALS — BP 110/80 | HR 116 | Temp 97.5°F | Ht 66.0 in | Wt 94.0 lb

## 2018-09-16 DIAGNOSIS — E43 Unspecified severe protein-calorie malnutrition: Secondary | ICD-10-CM

## 2018-09-16 DIAGNOSIS — M7989 Other specified soft tissue disorders: Secondary | ICD-10-CM | POA: Diagnosis not present

## 2018-09-16 DIAGNOSIS — M25572 Pain in left ankle and joints of left foot: Secondary | ICD-10-CM | POA: Diagnosis not present

## 2018-09-16 DIAGNOSIS — F0390 Unspecified dementia without behavioral disturbance: Secondary | ICD-10-CM | POA: Diagnosis not present

## 2018-09-16 MED ORDER — MIRTAZAPINE 15 MG PO TABS: 15.0000 mg | ORAL_TABLET | Freq: Every day | ORAL | 6 refills | Status: AC

## 2018-09-16 NOTE — Assessment & Plan Note (Signed)
Unclear history about left ankle pain and patient cannot tell if any injury occurred.

## 2018-09-16 NOTE — Assessment & Plan Note (Signed)
Rx for remeron to see if this helps boost appetite. She is doing ensure 1-2 cans per day and we talked about how this is not enough calories if she is not eating anything else. Weight is still going down and we talked about how her dementia is likely playing into her lack of appetite and we may not be able to affect this.

## 2018-09-16 NOTE — Assessment & Plan Note (Signed)
DG tib/fib and ankle left to check for fracture. Given her low weight she could have fragility fracture or with dementia could have unknown injury. If no fracture can treat with prednisone to see is gout is causing.

## 2018-09-16 NOTE — Patient Instructions (Addendum)
We have sent in mirtazepine to take at night time to help with appetite.   We are checking an x-ray of the ankle today to see if there is any fracture.   If there is no fracture we will treat for gout with a medicine called prednisone.

## 2018-09-16 NOTE — Progress Notes (Signed)
   Subjective:    Patient ID: Gwendolyn Bright, female    DOB: [birth year], 77 y.o.   MRN: [id number]  HPI The patient is a 77 YO female coming in for several problems including left ankle pain and swelling (started a few days ago, denies fall or injury, she does have memory problems and it is unclear if she would remember, swelling at the ankle and cannot walk on it, denies known overuse or injury, overall stable since onset, 10/10 pain) and weight loss (she has no appetite, is drinking ensure at least 1-2 per day, some days does not eat at all or a few bites, is losing weight consistently, does have dementia which is fairly severe).   Review of Systems  Unable to perform ROS: Dementia  Constitutional: Positive for appetite change and unexpected weight change.  Musculoskeletal: Positive for arthralgias, gait problem, joint swelling and myalgias.      Objective:   Physical Exam Constitutional:      Appearance: She is well-developed.     Comments: Cachexia, temporal wasting  HENT:     Head: Normocephalic and atraumatic.  Neck:     Musculoskeletal: Normal range of motion.  Cardiovascular:     Rate and Rhythm: Normal rate and regular rhythm.  Pulmonary:     Effort: Pulmonary effort is normal.  Abdominal:     Palpations: Abdomen is soft.  Musculoskeletal:        General: Swelling and tenderness present.     Comments: Extreme tenderness to the left ankle medial and lateral and up to mid shin region, swelling around the ankle and some bruising color to skin  Skin:    General: Skin is warm and dry.  Neurological:     Mental Status: She is alert and oriented to person, place, and time.     Coordination: Coordination abnormal.     Comments: In wheelchair today    Vitals:   09/16/18 1431  BP: 110/80  Temp: (!) 97.5 F (36.4 C)  TempSrc: Axillary  Weight: 94 lb (42.6 kg)  Height: 5\' 6"  (1.676 m)      Assessment & Plan:

## 2018-09-17 ENCOUNTER — Other Ambulatory Visit: Payer: Self-pay | Admitting: Internal Medicine

## 2018-09-17 MED ORDER — PREDNISONE 20 MG PO TABS: 40.0000 mg | ORAL_TABLET | Freq: Every day | ORAL | 0 refills | Status: AC

## 2018-09-30 IMAGING — CT CT ABD-PELV W/ CM
2 of 5 series · 14 of 46 positions shown, 16 images · IV contrast (APPLIED)
Comparison: 12/05/2017 chest CT, CT abdomen and pelvis 12/05/2017

CLINICAL DATA: Blunt trauma. History of dementia. Patient found on
the floor by family this evening.

EXAM:
CT CHEST, ABDOMEN, AND PELVIS WITH CONTRAST
TECHNIQUE: Multidetector CT imaging of the chest, abdomen and pelvis was
performed following the standard protocol during bolus
administration of intravenous contrast.
CONTRAST:  100mL OMNIPAQUE IOHEXOL 300 MG/ML  SOLN

[Series 3: cap 5.0 i31f 1 · axial · 0.86mm/px · z∈[-746,-206]mm · 11 of 122 slices shown, 13 images]
[im 7/122  soft-tissue]
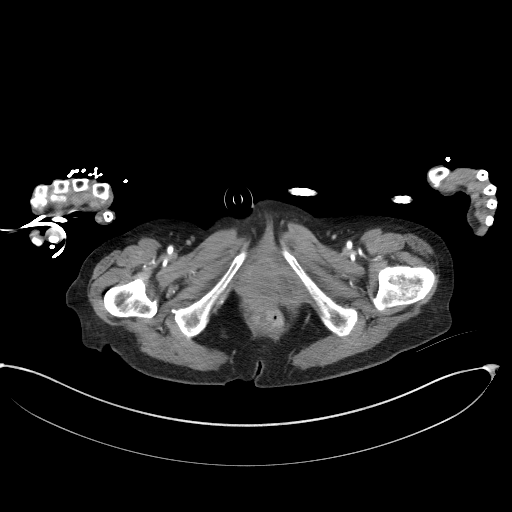
[im 7/122  bone]
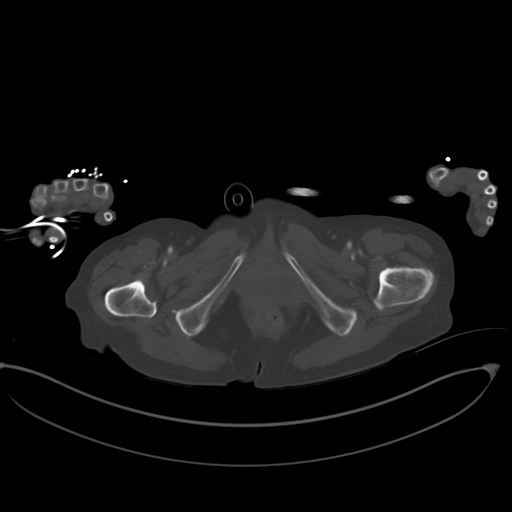
[im 21/122  soft-tissue]
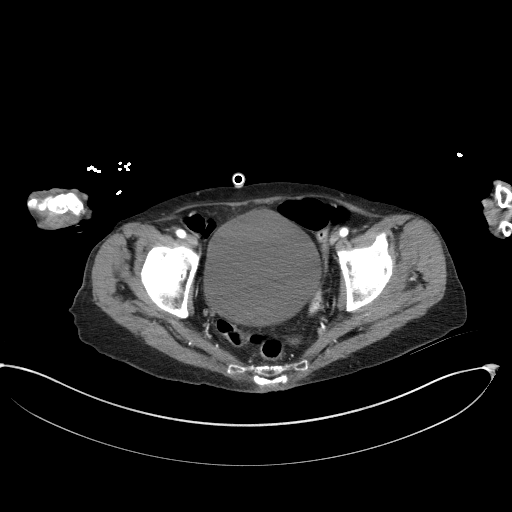
[im 27/122  soft-tissue]
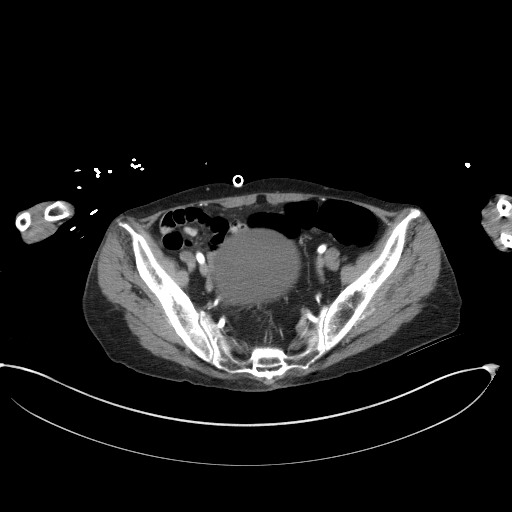
[im 41/122  soft-tissue]
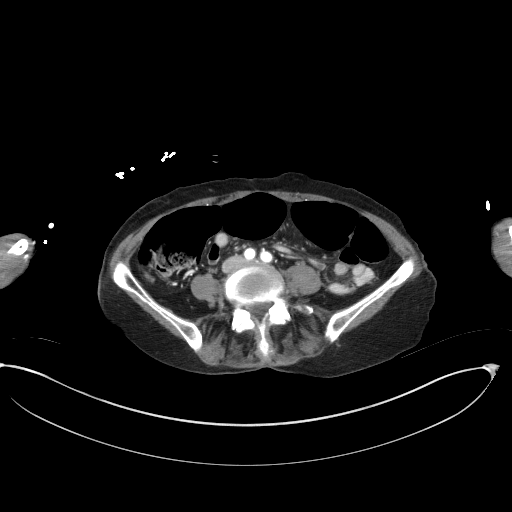
[im 48/122  soft-tissue]
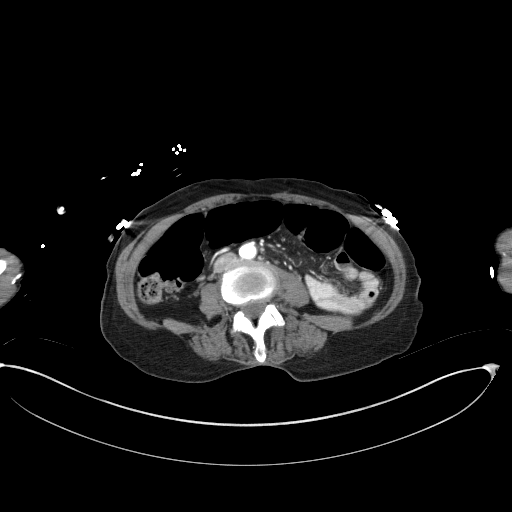
[im 61/122  soft-tissue]
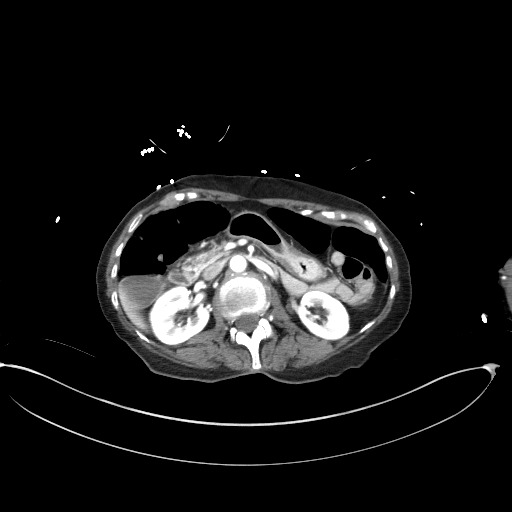
[im 74/122  soft-tissue]
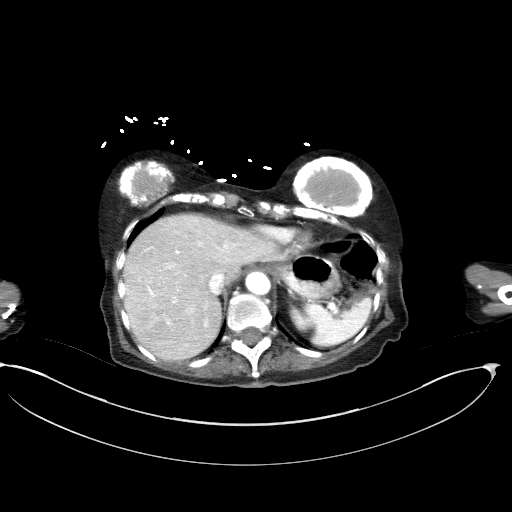
[im 81/122  soft-tissue]
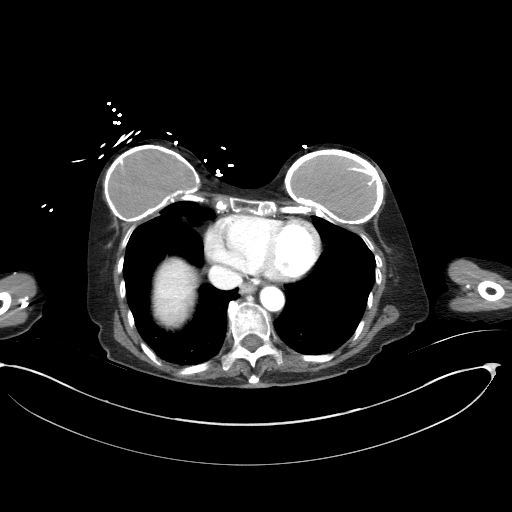
[im 95/122  soft-tissue]
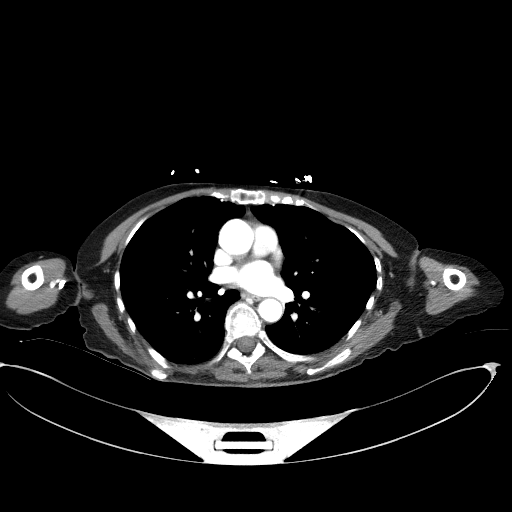
[im 95/122  bone]
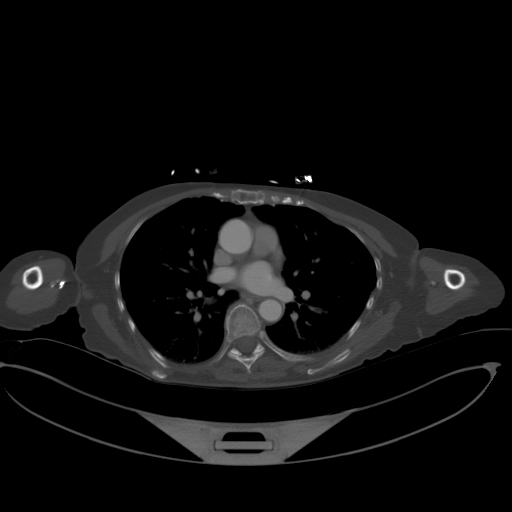
[im 101/122  soft-tissue]
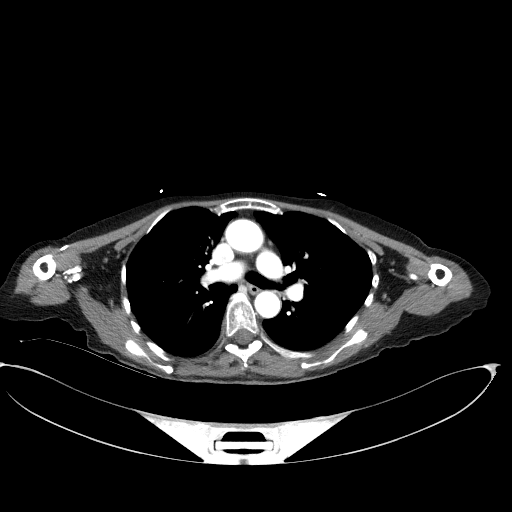
[im 115/122  soft-tissue]
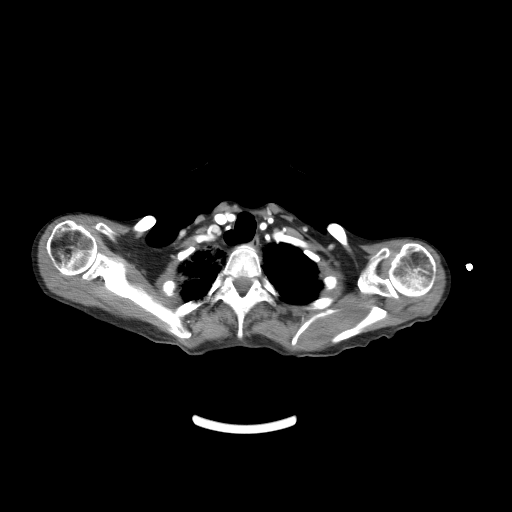

[Series 6: coronal · coronal · 0.66mm/px · 3 of 68 slices shown]
[im 23/68  soft-tissue]
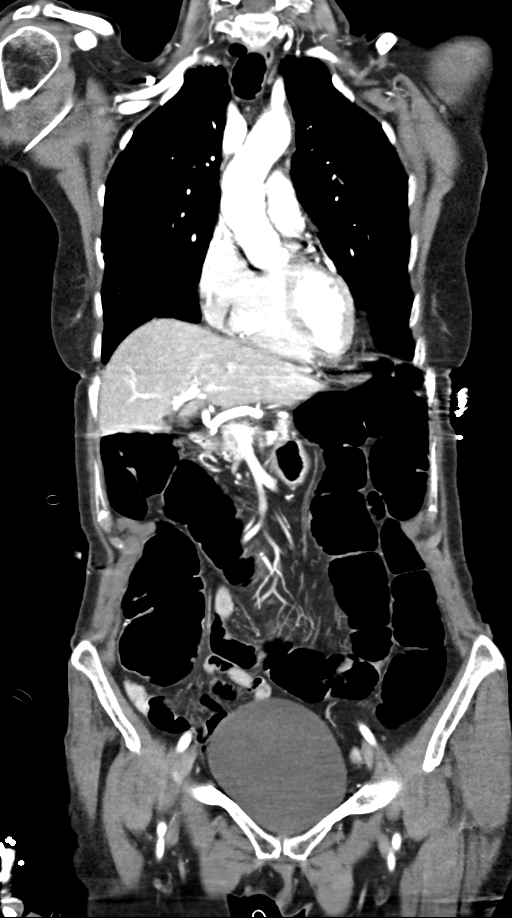
[im 30/68  soft-tissue]
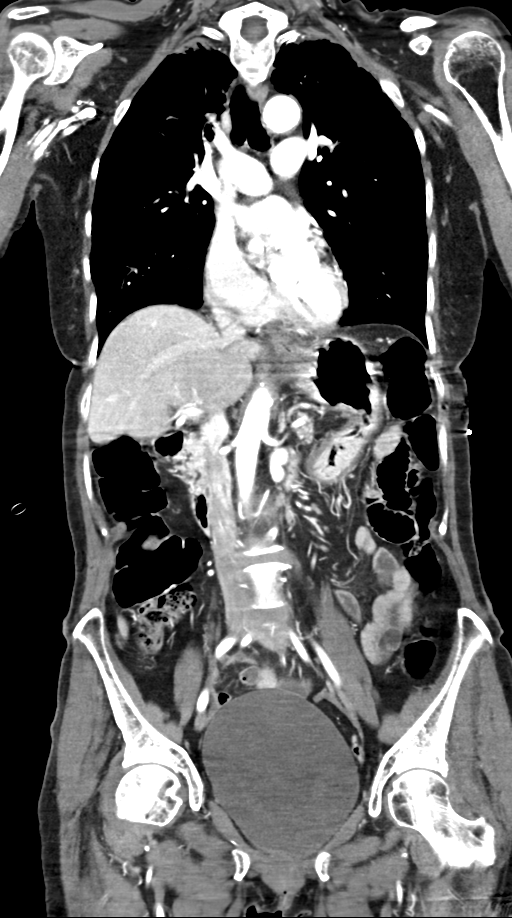
[im 38/68  soft-tissue]
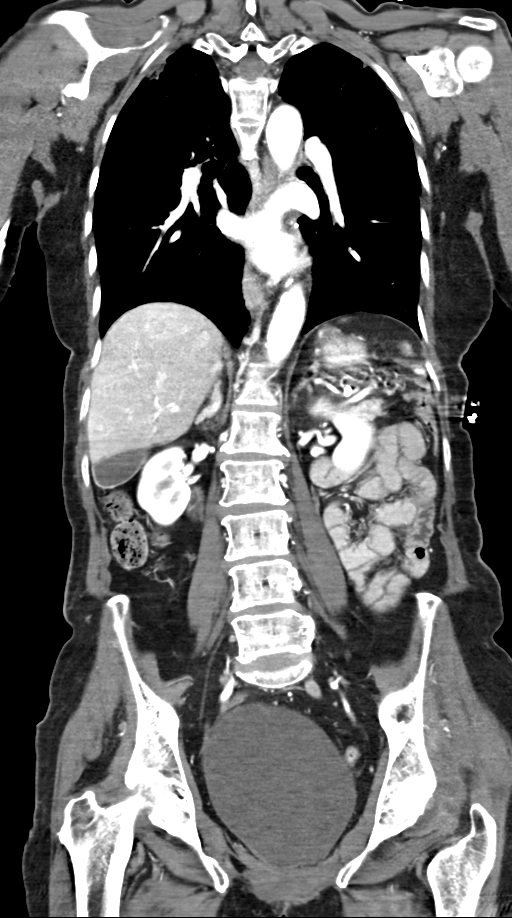

[14 of 46 positions shown; findings below may reference images not displayed]

FINDINGS: CT CHEST FINDINGS

CARDIOVASCULAR: Heart size is normal. No pericardial effusion.
Nonaneurysmal atherosclerotic thoracic aorta. No mediastinal
hematoma. No large central pulmonary embolus. Great vessels are
unremarkable and patent..

MEDIASTINUM/NODES: Unremarkable thyroid gland. No lymphadenopathy by
CT size criteria. Patent trachea and mainstem bronchi. CT appearance
of the esophagus is unremarkable. No hilar lymphadenopathy is noted.
Midline patent trachea and mainstem bronchi.

LUNGS/PLEURA: No acute pulmonary consolidation, effusion or
pneumothorax. No dominant mass is seen. Scarring at the apices with
right upper lobe bronchiectasis and dystrophic calcifications.
Lesser degree of apical pleuroparenchymal scarring with
calcifications on the left. No pulmonary contusion, effusion or
pneumothorax.

MUSCULOSKELETAL: Calcified subglandular bilateral breast implants.
No acute osseous appearing abnormality is noted.

CT ABDOMEN AND PELVIS FINDINGS

HEPATIC BILIARY: Steatosis of the liver. No liver laceration or
subcapsular fluid. Physiologic distention of the gallbladder without
stones. No biliary dilatation is identified.

PANCREAS: No pancreatic mass, inflammation or ductal dilatation.

SPLEEN: Normal size spleen without mass.

ADRENALS/URINARY TRACT: Normal bilateral adrenal glands and kidneys.
No enhancing mass lesion of either kidney. Stable 8 mm cortical cyst
in the lower pole the right kidney. No nephrolithiasis nor
obstructive uropathy. The ureters are not dilated. No
hydroureteronephrosis is noted. Distended urinary bladder without
focal mural thickening or calculus.

STOMACH/BOWEL: The stomach, small and large bowel are normal in
course and caliber without mural thickening or inflammation. No
mechanical bowel obstruction is noted. Appendectomy.

VASCULAR/LYMPHATIC: Nonaneurysmal thoracic aorta. No lymphadenopathy
by CT size criteria.

REPRODUCTIVE: Hysterectomy.  No adnexal mass.

OTHER: No free air nor free fluid.

MUSCULOSKELETAL: Mild dextroconvex curvature of the lumbar spine. No
acute nor suspicious osseous lesions.
IMPRESSION: 1. No acute chest, abdomen or pelvic abnormality.
2. Chronic stable pleuroparenchymal changes and scarring at the
apices of both lungs, right middle lobe and lingula.
3. No acute solid nor hollow visceral organ injury.
4. Mild hepatic steatosis.

## 2018-10-16 ENCOUNTER — Encounter (HOSPITAL_COMMUNITY): Payer: Self-pay

## 2018-10-16 ENCOUNTER — Other Ambulatory Visit: Payer: Self-pay

## 2018-10-16 ENCOUNTER — Telehealth: Payer: Self-pay

## 2018-10-16 ENCOUNTER — Emergency Department (HOSPITAL_COMMUNITY)
Admission: EM | Admit: 2018-10-16 | Discharge: 2018-10-16 | Disposition: A | Discharge status: Home / Self Care | Payer: Medicare HMO | Attending: Emergency Medicine | Admitting: Emergency Medicine

## 2018-10-16 ENCOUNTER — Emergency Department (HOSPITAL_COMMUNITY): Payer: Medicare HMO

## 2018-10-16 DIAGNOSIS — Z79899 Other long term (current) drug therapy: Secondary | ICD-10-CM | POA: Insufficient documentation

## 2018-10-16 DIAGNOSIS — R531 Weakness: Secondary | ICD-10-CM | POA: Insufficient documentation

## 2018-10-16 DIAGNOSIS — R05 Cough: Secondary | ICD-10-CM | POA: Diagnosis not present

## 2018-10-16 DIAGNOSIS — R404 Transient alteration of awareness: Secondary | ICD-10-CM | POA: Diagnosis not present

## 2018-10-16 DIAGNOSIS — R0902 Hypoxemia: Secondary | ICD-10-CM | POA: Diagnosis not present

## 2018-10-16 DIAGNOSIS — F0390 Unspecified dementia without behavioral disturbance: Secondary | ICD-10-CM | POA: Insufficient documentation

## 2018-10-16 DIAGNOSIS — R627 Adult failure to thrive: Secondary | ICD-10-CM | POA: Diagnosis present

## 2018-10-16 LAB — COMPREHENSIVE METABOLIC PANEL
ALT: 17 U/L (ref 0–44)
AST: 33 U/L (ref 15–41)
Albumin: 4.5 g/dL (ref 3.5–5.0)
Alkaline Phosphatase: 98 U/L (ref 38–126)
Anion gap: 15 (ref 5–15)
BUN: 22 mg/dL (ref 8–23)
CO2: 21 mmol/L — ABNORMAL LOW (ref 22–32)
Calcium: 9.7 mg/dL (ref 8.9–10.3)
Chloride: 106 mmol/L (ref 98–111)
Creatinine, Ser: 0.59 mg/dL (ref 0.44–1.00)
GFR calc Af Amer: >60 mL/min (ref >60)
GFR calc non Af Amer: >60 mL/min (ref >60)
Glucose, Bld: 116 mg/dL — ABNORMAL HIGH (ref 70–99)
POTASSIUM: 4.2 mmol/L (ref 3.5–5.1)
SODIUM: 142 mmol/L (ref 135–145)
Total Bilirubin: 1.4 mg/dL — ABNORMAL HIGH (ref 0.3–1.2)
Total Protein: 8.5 g/dL — ABNORMAL HIGH (ref 6.5–8.1)

## 2018-10-16 LAB — CBC WITH DIFFERENTIAL/PLATELET
Abs Immature Granulocytes: 0.1 10*3/uL — ABNORMAL HIGH (ref 0.00–0.07)
Basophils Absolute: 0 10*3/uL (ref 0.0–0.1)
Basophils Relative: 0 %
Eosinophils Absolute: 0 10*3/uL (ref 0.0–0.5)
Eosinophils Relative: 0 %
HCT: 50.7 % — ABNORMAL HIGH (ref 36.0–46.0)
Hemoglobin: 16 g/dL — ABNORMAL HIGH (ref 12.0–15.0)
IMMATURE GRANULOCYTES: 1 %
Lymphocytes Relative: 12 %
Lymphs Abs: 1.9 10*3/uL (ref 0.7–4.0)
MCH: 29 pg (ref 26.0–34.0)
MCHC: 31.6 g/dL (ref 30.0–36.0)
MCV: 91.8 fL (ref 80.0–100.0)
Monocytes Absolute: 1.7 10*3/uL — ABNORMAL HIGH (ref 0.1–1.0)
Monocytes Relative: 11 %
NEUTROS PCT: 76 %
NRBC: 0 % (ref 0.0–0.2)
Neutro Abs: 11.4 10*3/uL — ABNORMAL HIGH (ref 1.7–7.7)
Platelets: 197 10*3/uL (ref 150–400)
RBC: 5.52 MIL/uL — ABNORMAL HIGH (ref 3.87–5.11)
RDW: 15.6 % — ABNORMAL HIGH (ref 11.5–15.5)
WBC: 15.2 10*3/uL — AB (ref 4.0–10.5)

## 2018-10-16 LAB — URINALYSIS, ROUTINE W REFLEX MICROSCOPIC
BACTERIA UA: NONE SEEN
Bilirubin Urine: NEGATIVE
Glucose, UA: NEGATIVE mg/dL
Ketones, ur: NEGATIVE mg/dL
Leukocytes, UA: NEGATIVE
Nitrite: NEGATIVE
Protein, ur: NEGATIVE mg/dL
Specific Gravity, Urine: 1.006 (ref 1.005–1.030)
pH: 6 (ref 5.0–8.0)

## 2018-10-16 MED ORDER — SODIUM CHLORIDE 0.9 % IV BOLUS
1000.0000 mL | Freq: Once | INTRAVENOUS | Status: AC
  Administered 2018-10-16: 1000 mL via INTRAVENOUS

## 2018-10-16 NOTE — Progress Notes (Addendum)
Consult request has been received. CSW attempting to follow up at present time.  CSW met with pt's EDP who requested CSW speak with pt's husband about needed resources.  CSW spoke to pt's husband and explained the limitations of the pt's insurance (short-term rehab/acute physical rehab-able needs) and pt's husband voiced understanding that pt is experiencing generalized weakness and lack of mental capacity due to her condition(s).  5:47 PM CSW received a call from [place] at [facility] at ph: [phone] who stated she would follow up with the pt's husband tomorrow and that if pt D/C's home then a Hospice Nurse would come to the pt's home and speak with the pt/pt's husband there post-D/C.  CSW will update pt's husband/EDP.  6:15 PM EDP/pt's husband updated.  Pt's husband is aware that although a couple of test results are still lacking, it is unlikely at this point pt will meet criteria for inpatient admission.  Pt's husband voiced understanding and is aware that Anderson Malta, RN at [facility] stated she would follow up with the pt's husband tomorrow in home if necessary.  CSW provided pt's husband with the contact information for [facility] at [facility].  Pt's husband was appreciative and thanked the  CSW.  CSW will continue to follow for D/C needs.  Alphonse Guild. Sadiya Durand, LCSW, LCAS, CSI Clinical Social Worker Ph: [phone]

## 2018-10-16 NOTE — ED Triage Notes (Addendum)
Patient BIB EMS from home, where she lives with her husband. Patient has history of Alzheimers/Dementia, with rapid progression over the past 6 months, per husband. Patient's husband states she has had decreased PO intake, with no PO intake in the past 24 hours. Patient husband states patient is increasingly weak due to decreased intake, and the last time she got like this, they put water in her veins and she was fine." Patient has golden ticket in nursing station file cabinet.  Patient husband states his insurance paid for 6 weeks of palliative caregiver, but that is now over. Patient's husband would like a hospice consult. CBG for EMS= 145, VSS. Hospice contact: Gonzella Lex [phone]

## 2018-10-16 NOTE — Telephone Encounter (Signed)
VM mail received from [facility] that they were on the scene with patient and would like to speak with Palliative NP. Enid Derry NP aware and to contact EMS

## 2018-10-16 NOTE — ED Notes (Signed)
Bed: [facility] Expected date:  Expected time:  Means of arrival:  Comments: EMS-FTT

## 2018-10-16 NOTE — Progress Notes (Signed)
Anderson Malta RN at [facility] at ph: [phone].  You should be contacted by an RN at [facility] on 10/17/17.  Please call if you don't hear from Hospice.

## 2018-10-16 NOTE — Progress Notes (Signed)
[facility]) hospital liaison note.  Venia Carbon, RN attempted to reach husband by telephone. Left message. She will follow in am if patient is admitted, or follow up with him at home if patient is discharged.   Please call with any hospice related questions.  Thank you,  Farrel Gordon, RN, CCM [facility] hospital liaison [phone]

## 2018-10-16 NOTE — ED Provider Notes (Signed)
[facility] DEPT Provider Note   CSN: [id number] Arrival date & time: 10/16/18  1415     History   Chief Complaint Chief Complaint  Patient presents with  . Failure To Thrive    HPI Gwendolyn Bright is a 78 y.o. female.  LEVEL 5 CAVEAT DUE TO DEMENTIA, HISTORY OBTAINED FROM HUSBAND. The history is provided by the spouse.  Altered Mental Status  Presenting symptoms: lethargy and partial responsiveness   Severity:  Moderate Most recent episode:  More than 2 days ago Timing:  Constant Progression:  Worsening Chronicity:  Chronic Context: dementia (slightly worse over last few days, not eating or drinking, husband is primary caregiver, use to have home health, cant affod long term care. )     Past Medical History:  Diagnosis Date  . Anxiety   . Constipation   . Dementia ([place])   . Skin cancer   . UTI (urinary tract infection) 01/2017  . Weight loss     Patient Active Problem List   Diagnosis Date Noted  . Acute left ankle pain 09/16/2018  . Pressure injury of skin 06/25/2018  . Hypokalemia 06/24/2018  . Dementia ([place]) 06/24/2018  . Failure to thrive in adult 06/24/2018  . Rhabdomyolysis 06/24/2018  . Encounter for general adult medical examination with abnormal findings 03/21/2018  . Insomnia 06/12/2017  . Protein-calorie malnutrition, severe 02/23/2017  . 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 Constipation   . Anxiety and depression 09/13/2016    Past Surgical History:  Procedure Laterality Date  . APPENDECTOMY    . CATARACT EXTRACTION    . skin cancer remo       OB History   No obstetric history on file.      Home Medications    Prior to Admission medications   Medication Sig Start Date End Date Taking? Authorizing Provider  mirtazapine (REMERON) 15 MG tablet Take 1 tablet (15 mg total) by mouth at bedtime. 09/16/18  Yes Hoyt Koch, MD  predniSONE (DELTASONE) 20 MG tablet Take 2 tablets (40 mg total) by  mouth daily with breakfast. 09/17/18  Yes Hoyt Koch, MD  Sennosides (EX-LAX PO) Take 1 tablet by mouth daily as needed (constipation).   Yes [provider]  zolpidem (AMBIEN) 5 MG tablet Take 1 tablet (5 mg total) by mouth at bedtime. 08/06/18  Yes Hoyt Koch, MD  acetaminophen (TYLENOL) 325 MG tablet Take 2 tablets (650 mg total) by mouth every 6 (six) hours as needed for mild pain (or Fever >/= 101). Patient not taking: Reported on 10/16/2018 06/27/18   Elgergawy, Silver Huguenin, MD  feeding supplement, ENSURE ENLIVE, (ENSURE ENLIVE) LIQD Take 237 mLs by mouth 2 (two) times daily between meals. Patient not taking: Reported on 10/16/2018 02/23/17   Lavina Hamman, MD  Polyethyl Glycol-Propyl Glycol (SYSTANE OP) Apply 1-2 drops to eye daily as needed (dryness).    [provider]    Family History Family History  Problem Relation Age of Onset  . Tuberculosis Mother   . Alcohol abuse Father     Social History Social History   Tobacco Use  . Smoking status: Never Smoker  . Smokeless tobacco: Never Used  Substance Use Topics  . Alcohol use: No  . Drug use: No     Allergies   Codeine and Yellow jacket venom [bee venom]   Review of Systems Review of Systems  Unable to perform ROS: Dementia     Physical Exam Updated  Vital Signs BP (!) 129/54   Pulse 84   Temp 97.7 F (36.5 C) (Oral)   Resp 18   SpO2 94%   Physical Exam Constitutional:      Appearance: She is cachectic.  HENT:     Head: Normocephalic and atraumatic.     Nose: Nose normal.     Mouth/Throat:     Mouth: Mucous membranes are dry.  Eyes:     Extraocular Movements: Extraocular movements intact.     Conjunctiva/sclera: Conjunctivae normal.     Pupils: Pupils are equal, round, and reactive to light.  Neck:     Musculoskeletal: Normal range of motion and neck supple.  Cardiovascular:     Rate and Rhythm: Normal rate and regular rhythm.     Pulses: Normal pulses.      Heart sounds: Normal heart sounds.  Pulmonary:     Effort: Pulmonary effort is normal.     Breath sounds: No wheezing, rhonchi or rales.  Abdominal:     General: Abdomen is flat.     Tenderness: There is no abdominal tenderness.  Musculoskeletal: Normal range of motion.        General: No swelling or tenderness.  Skin:    General: Skin is warm.     Capillary Refill: Capillary refill takes less than 2 seconds.  Neurological:     General: No focal deficit present.     Mental Status: She is alert.  Psychiatric:        Mood and Affect: Mood normal.      ED Treatments / Results  Labs (all labs ordered are listed, but only abnormal results are displayed) Labs Reviewed  CBC WITH DIFFERENTIAL/PLATELET - Abnormal; Notable for the following components:      Result Value   WBC 15.2 (*)    RBC 5.52 (*)    Hemoglobin 16.0 (*)    HCT 50.7 (*)    RDW 15.6 (*)    Neutro Abs 11.4 (*)    Monocytes Absolute 1.7 (*)    Abs Immature Granulocytes 0.10 (*)    All other components within normal limits  COMPREHENSIVE METABOLIC PANEL - Abnormal; Notable for the following components:   CO2 21 (*)    Glucose, Bld 116 (*)    Total Protein 8.5 (*)    Total Bilirubin 1.4 (*)    All other components within normal limits  URINALYSIS, ROUTINE W REFLEX MICROSCOPIC - Abnormal; Notable for the following components:   Color, Urine STRAW (*)    Hgb urine dipstick SMALL (*)    All other components within normal limits  URINE CULTURE    EKG EKG Interpretation  Date/Time:  Thursday October 16 2018 16:30:49 EST Ventricular Rate:  80 PR Interval:    QRS Duration: 80 QT Interval:  378 QTC Calculation: 436 R Axis:   94 Text Interpretation:  Sinus rhythm Anterior infarct, old Nonspecific T abnormalities, lateral leads Confirmed by Lennice Sites [phone]) on 10/16/2018 4:37:03 PM   Radiology Dg Chest 2 View  Result Date: 10/16/2018 CLINICAL DATA:  Cough. EXAM: CHEST - 2 VIEW COMPARISON:  Portable  chest dated 06/24/2018. FINDINGS: Normal sized heart. Clear lungs. Bilateral breast implants with extensive capsular calcifications. Unremarkable bones. IMPRESSION: No acute abnormality. Electronically Signed   By: Claudie Revering M.D.   On: 10/16/2018 17:55    Procedures Procedures (including critical care time)  Medications Ordered in ED Medications  sodium chloride 0.9 % bolus 1,000 mL (1,000 mLs Intravenous Bolus 10/16/18 1733)  Initial Impression / Assessment and Plan / ED Course  I have reviewed the triage vital signs and the nursing notes.  Pertinent labs & imaging results that were available during my care of the patient were reviewed by me and considered in my medical decision making (see chart for details).     AMOY STEEVES is a 78 year old female with history of severe dementia who presents to the ED with worsening dementia.  Patient with normal vitals.  No fever.  Husband is here as he is sole caregiver.  Does not have any support at home.  She had recent admission for dehydration and did had home health for several weeks but now has not had any help at home with this patient.  He states that it is becoming increasingly more difficult to feed her and take care of her.  She overall is alert and neurologically stable.  Chest x-ray showed no pneumonia.  Urinalysis showed no infection.  No signs of electrolyte abnormality or acute kidney injury.  Patient was given fluid bolus and had improvement.  Contacted social work and case [employer].  They have arranged for hospice to come to the house tomorrow for further evaluation.  Patient was discharged from the ED in good condition as patient likely with just worsening of chronic dementia.  This chart was dictated using voice recognition software.  Despite best efforts to proofread,  errors can occur which can change the documentation meaning.   Final Clinical Impressions(s) / ED Diagnoses   Final diagnoses:  Weakness    ED Discharge  Orders    None       Lennice Sites, DO 10/16/18 2013

## 2018-10-17 ENCOUNTER — Telehealth: Payer: Self-pay | Admitting: Internal Medicine

## 2018-10-17 NOTE — Telephone Encounter (Signed)
Copied from [place]. Topic: General - Other >> Oct 17, 2018 10:42 AM Lennox Solders wrote: Reason for CRM: jen with [facility] is calling the pt is now in hospice and she would like to know if dr crawford would be attending of records

## 2018-10-17 NOTE — Telephone Encounter (Signed)
Informed Gwendolyn Bright that Dr. Sharlet Salina would be the attending

## 2018-10-18 DIAGNOSIS — R413 Other amnesia: Secondary | ICD-10-CM | POA: Insufficient documentation

## 2018-10-18 LAB — URINE CULTURE: Culture: NO GROWTH

## 2018-12-09 ENCOUNTER — Other Ambulatory Visit: Payer: Self-pay | Admitting: Internal Medicine

## 2018-12-09 NOTE — Telephone Encounter (Signed)
Control database checked last refill: 11/15/2018 LOV: 08/06/2018 for insomnia [id number]

## 2018-12-23 IMAGING — DX DG TIBIA/FIBULA 2V*L*
4 series · 4 of 4 positions shown · non-contrast
Comparison: Left ankle series today.

CLINICAL DATA: 77-year-old female with left lower extremity, ankle
swelling for 2 days with no known injury.

EXAM:
LEFT TIBIA AND FIBULA - 2 VIEW

[tibia ap (1 of 2)]
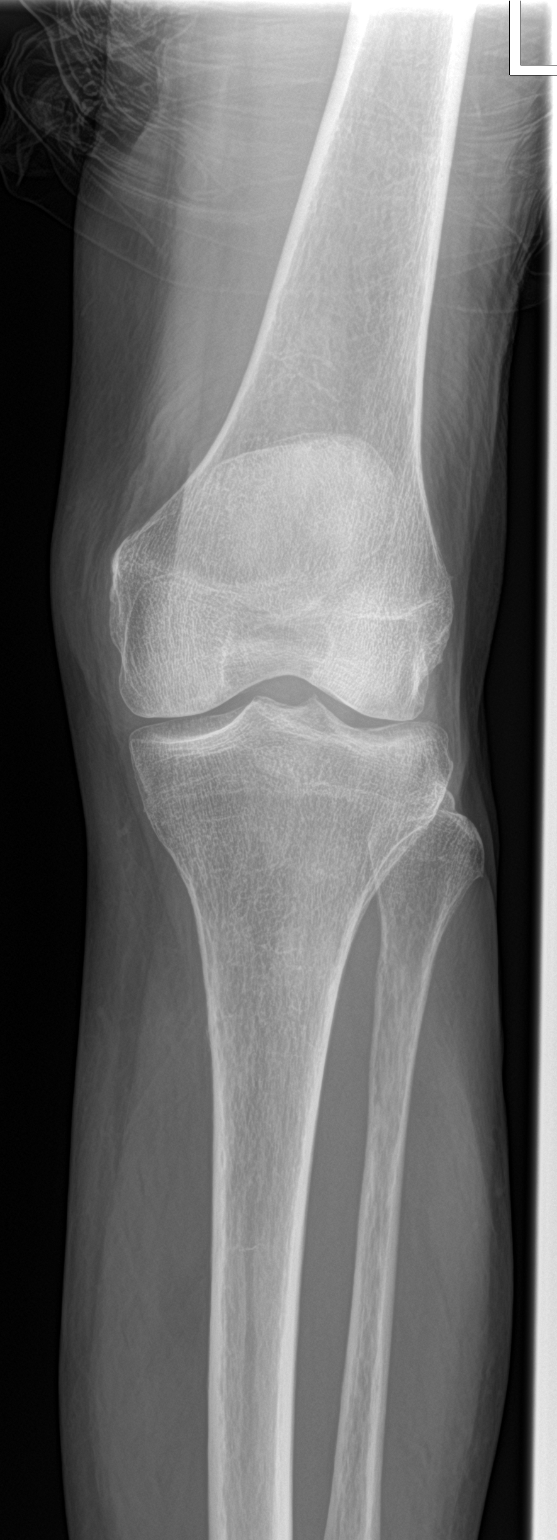

[tibia ap (2 of 2)]
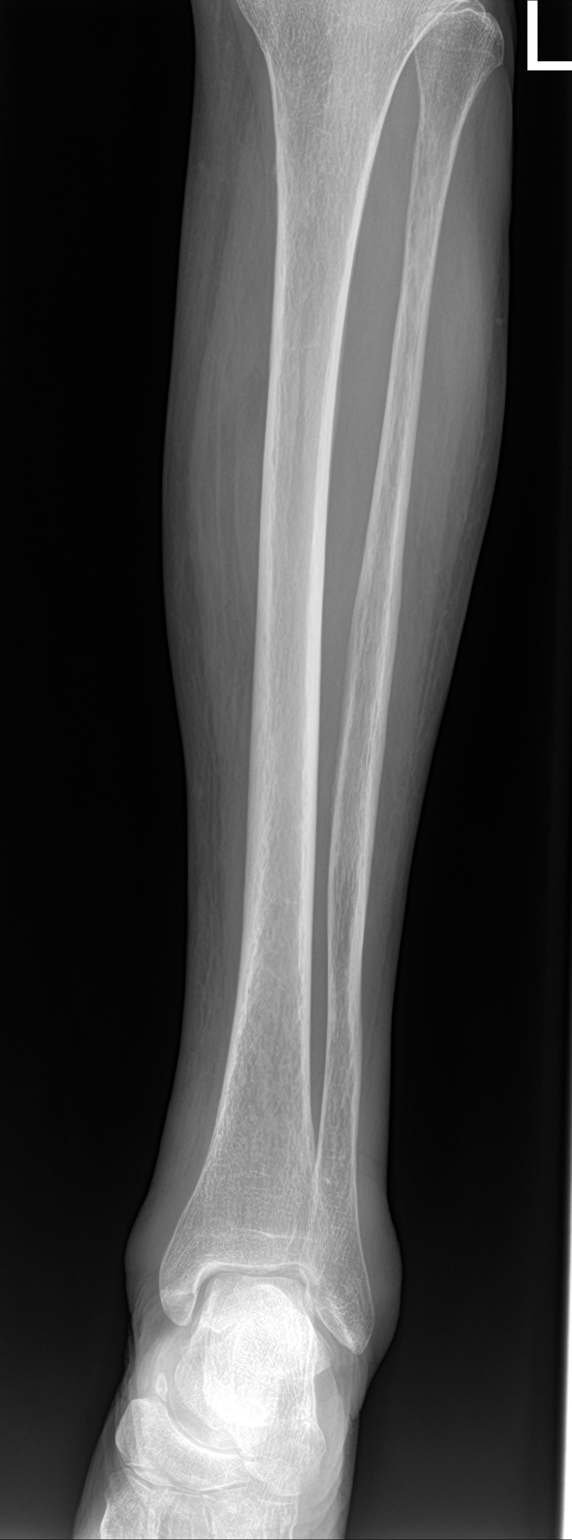

[tibia lat (1 of 2)]
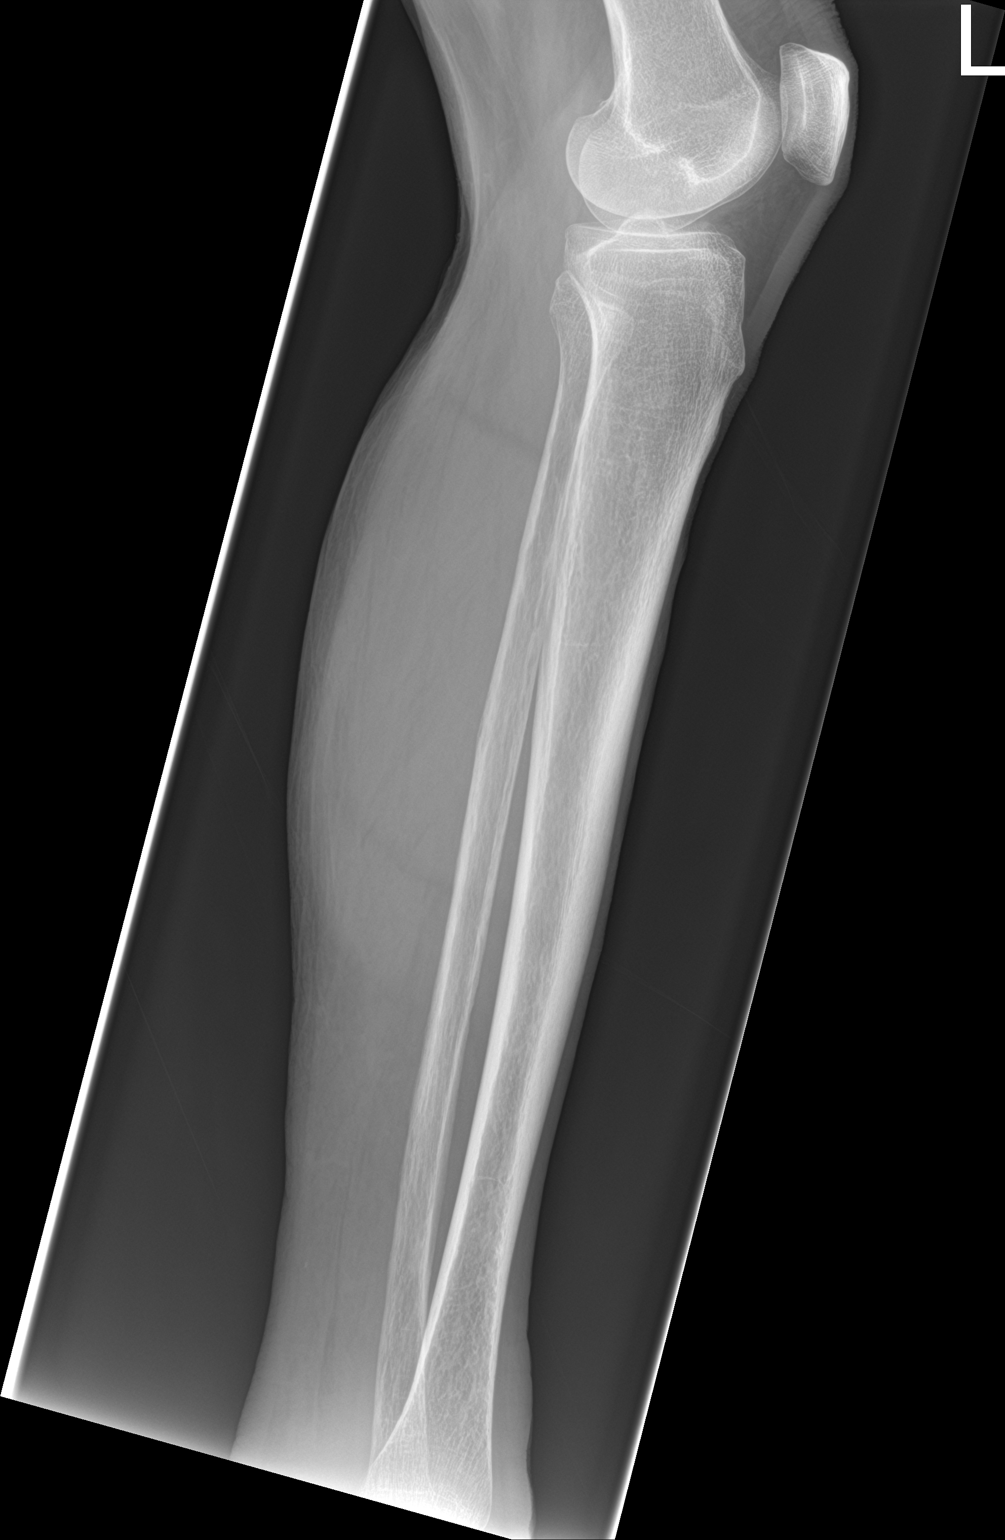

[tibia lat (2 of 2)]
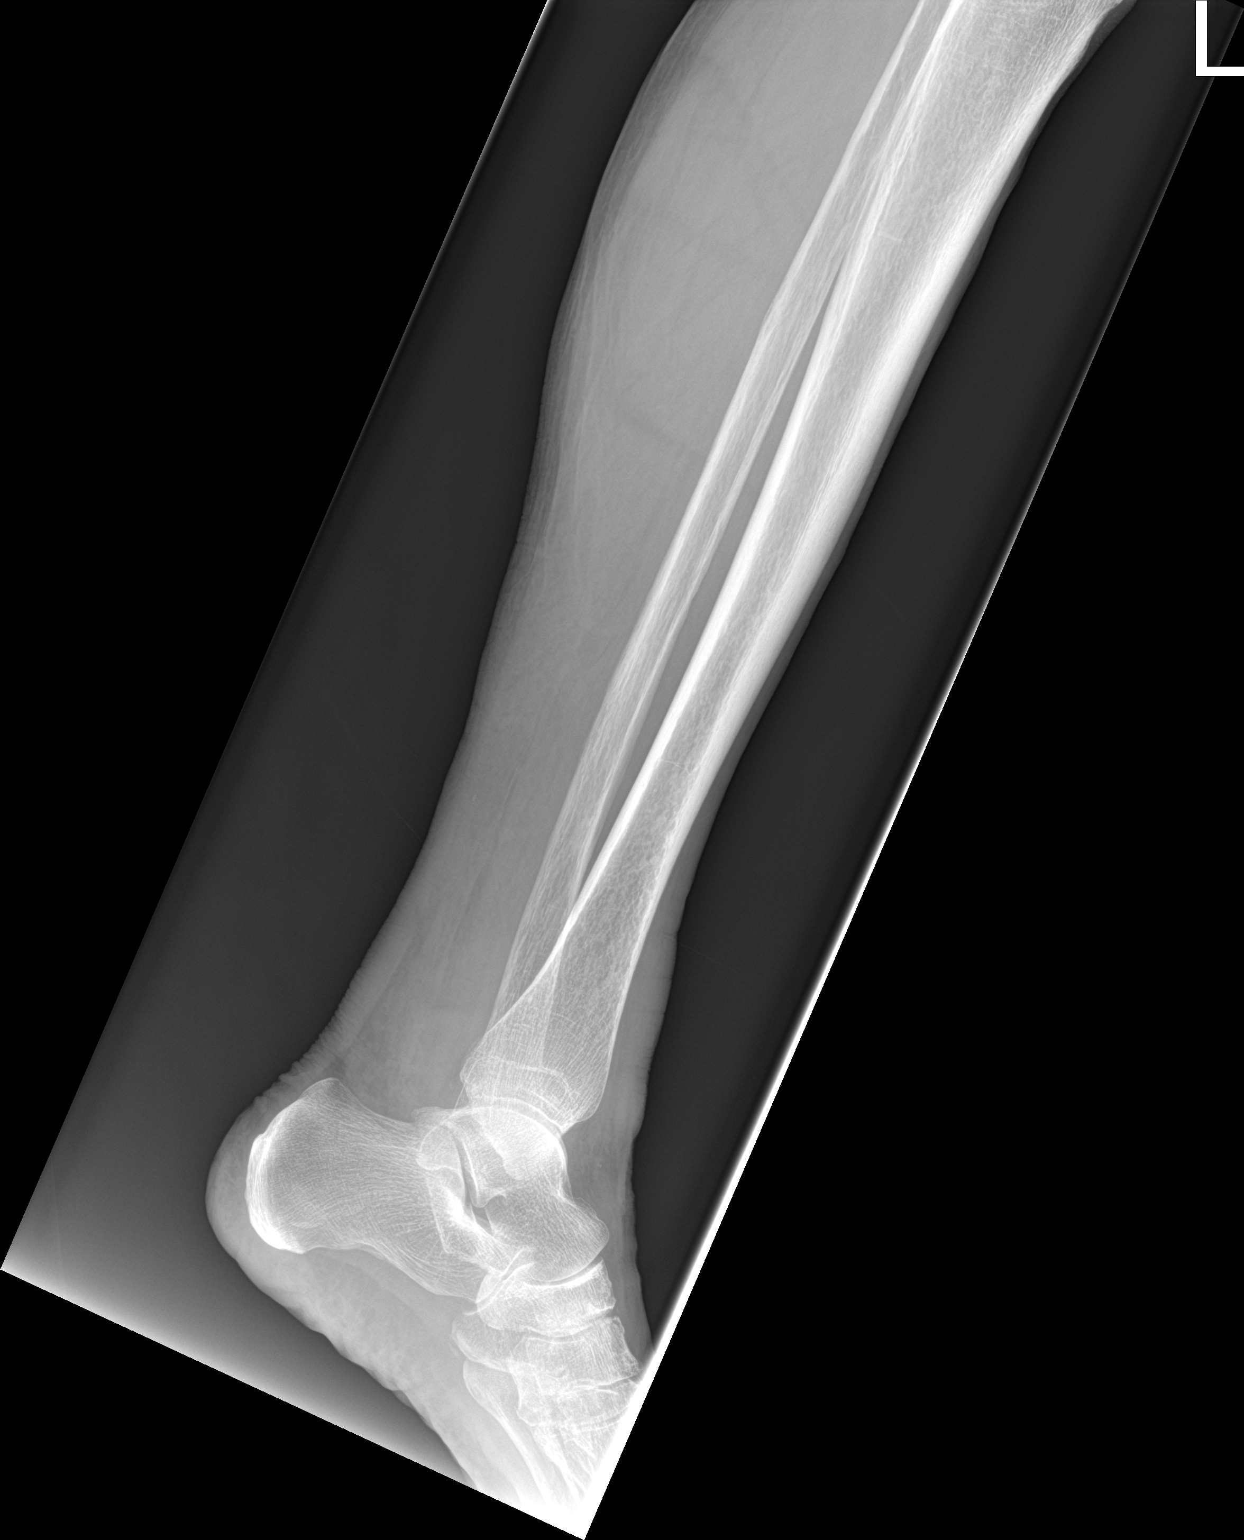

[4 of 4 positions shown; findings below may reference images not displayed]

FINDINGS: Bone mineralization is within normal limits for age. There is no
evidence of fracture or other focal bone lesions. Soft tissue
swelling about the medial and lateral ankle as described separately
today.
IMPRESSION: No acute osseous abnormality identified about the left tib-fib.

## 2019-02-02 ENCOUNTER — Telehealth: Payer: Self-pay | Admitting: Internal Medicine

## 2019-02-02 NOTE — Telephone Encounter (Signed)
Copied from [place] [phone]. Topic: Quick Communication - Home Health Verbal Orders >> Feb 02, 2019  1:20 PM Pauline Good wrote: Caller/Agency: [facility] Givens Callback Number: [phone] Requesting OT/PT/Skilled Nursing/Social Work/Speech Therapy: Hospice eval Frequency: Hospice eval

## 2019-02-02 NOTE — Telephone Encounter (Signed)
Gwendolyn Bright is calling back to give his fax number for the orders [phone]

## 2019-02-02 NOTE — Telephone Encounter (Signed)
Routing to dr Sharlet Salina, please advise, I will call home health back, thanks

## 2019-02-03 ENCOUNTER — Telehealth: Payer: Self-pay | Admitting: Internal Medicine

## 2019-02-03 NOTE — Telephone Encounter (Signed)
Gwendolyn Bright calling to check status of order. He states that a verbal order to his supervisor would be preferred, if possible.    Claiborne Billings Soil scientist) # [phone]

## 2019-02-03 NOTE — Telephone Encounter (Signed)
Will be attending

## 2019-02-03 NOTE — Telephone Encounter (Signed)
Informed tim of MD response

## 2019-02-03 NOTE — Telephone Encounter (Signed)
Okay with hospice referral.

## 2019-02-03 NOTE — Telephone Encounter (Signed)
LVM for [employer] giving verbals

## 2019-02-03 NOTE — Telephone Encounter (Signed)
Copied from [place] [phone]. Topic: General - Other >> Feb 03, 2019  1:53 PM Margot Ables wrote: Callers Name: Esther Hardy w/[facility]: cel # [phone], secure VM Reason for call: Octavia Bruckner is asking if Dr. Sharlet Salina wants to be attending for hospice or if she would like the medical director with hospice to be attending. Please advise.

## 2019-02-16 ENCOUNTER — Telehealth: Payer: Self-pay | Admitting: *Deleted

## 2019-02-16 ENCOUNTER — Telehealth: Payer: Self-pay

## 2019-02-16 NOTE — Telephone Encounter (Signed)
Received original D/C from [facility] -D/C forwarded to [place] (Primary) for signature.

## 2019-02-16 NOTE — Telephone Encounter (Signed)
Noted  

## 2019-02-16 NOTE — Telephone Encounter (Signed)
Copied from [place] [id number]. Topic: General - Other >> Feb 16, 2019 10:03 AM Jodie Echevaria wrote: Reason for CRM: Armida Sans from funeral home called they will be dropping off the death certificate to be signed by doctor Sharlet Salina. >> Feb 16, 2019 10:24 AM Para Skeans A wrote: Juluis Rainier!

## 2019-02-17 NOTE — Telephone Encounter (Signed)
Received original D/C signed-funeral home notified for pick up.  

## 2019-03-02 DEATH — deceased
